# Patient Record
Sex: Female | Born: 1950
Health system: Southern US, Community
[De-identification: ages and names within clinical notes are randomized; demographics above are authoritative.]

## PROBLEM LIST (undated history)

## (undated) DIAGNOSIS — E119 Type 2 diabetes mellitus without complications: Secondary | ICD-10-CM

## (undated) DIAGNOSIS — I10 Essential (primary) hypertension: Secondary | ICD-10-CM

## (undated) DIAGNOSIS — Z972 Presence of dental prosthetic device (complete) (partial): Secondary | ICD-10-CM

## (undated) DIAGNOSIS — K08109 Complete loss of teeth, unspecified cause, unspecified class: Secondary | ICD-10-CM

## (undated) DIAGNOSIS — K219 Gastro-esophageal reflux disease without esophagitis: Secondary | ICD-10-CM

## (undated) DIAGNOSIS — N950 Postmenopausal bleeding: Secondary | ICD-10-CM

## (undated) DIAGNOSIS — N189 Chronic kidney disease, unspecified: Secondary | ICD-10-CM

## (undated) DIAGNOSIS — Z973 Presence of spectacles and contact lenses: Secondary | ICD-10-CM

## (undated) DIAGNOSIS — D649 Anemia, unspecified: Secondary | ICD-10-CM

## (undated) DIAGNOSIS — M1990 Unspecified osteoarthritis, unspecified site: Secondary | ICD-10-CM

## (undated) HISTORY — DX: Anemia, unspecified: D64.9

## (undated) HISTORY — DX: Chronic kidney disease, unspecified: N18.9

---

## 1996-04-12 HISTORY — PX: BREAST MASS EXCISION: SHX1267

## 1998-10-15 ENCOUNTER — Other Ambulatory Visit: Admission: RE | Admit: 1998-10-15 | Discharge: 1998-10-15 | Payer: Self-pay | Admitting: Obstetrics and Gynecology

## 2000-03-09 ENCOUNTER — Other Ambulatory Visit: Admission: RE | Admit: 2000-03-09 | Discharge: 2000-03-09 | Payer: Self-pay | Admitting: Obstetrics and Gynecology

## 2001-07-20 ENCOUNTER — Other Ambulatory Visit: Admission: RE | Admit: 2001-07-20 | Discharge: 2001-07-20 | Payer: Self-pay | Admitting: Obstetrics and Gynecology

## 2002-09-20 ENCOUNTER — Other Ambulatory Visit: Admission: RE | Admit: 2002-09-20 | Discharge: 2002-09-20 | Payer: Self-pay | Admitting: Obstetrics and Gynecology

## 2003-09-26 ENCOUNTER — Other Ambulatory Visit: Admission: RE | Admit: 2003-09-26 | Discharge: 2003-09-26 | Payer: Self-pay | Admitting: Obstetrics and Gynecology

## 2004-10-01 ENCOUNTER — Other Ambulatory Visit: Admission: RE | Admit: 2004-10-01 | Discharge: 2004-10-01 | Payer: Self-pay | Admitting: Obstetrics and Gynecology

## 2018-12-20 DIAGNOSIS — I10 Essential (primary) hypertension: Secondary | ICD-10-CM | POA: Diagnosis not present

## 2018-12-20 DIAGNOSIS — Z6841 Body Mass Index (BMI) 40.0 and over, adult: Secondary | ICD-10-CM | POA: Diagnosis not present

## 2018-12-20 DIAGNOSIS — E1165 Type 2 diabetes mellitus with hyperglycemia: Secondary | ICD-10-CM | POA: Diagnosis not present

## 2018-12-20 DIAGNOSIS — Z299 Encounter for prophylactic measures, unspecified: Secondary | ICD-10-CM | POA: Diagnosis not present

## 2018-12-20 DIAGNOSIS — Z713 Dietary counseling and surveillance: Secondary | ICD-10-CM | POA: Diagnosis not present

## 2019-01-12 DIAGNOSIS — Z0000 Encounter for general adult medical examination without abnormal findings: Secondary | ICD-10-CM | POA: Diagnosis not present

## 2019-01-12 DIAGNOSIS — Z1331 Encounter for screening for depression: Secondary | ICD-10-CM | POA: Diagnosis not present

## 2019-01-12 DIAGNOSIS — Z79899 Other long term (current) drug therapy: Secondary | ICD-10-CM | POA: Diagnosis not present

## 2019-01-12 DIAGNOSIS — Z1339 Encounter for screening examination for other mental health and behavioral disorders: Secondary | ICD-10-CM | POA: Diagnosis not present

## 2019-01-12 DIAGNOSIS — Z299 Encounter for prophylactic measures, unspecified: Secondary | ICD-10-CM | POA: Diagnosis not present

## 2019-01-12 DIAGNOSIS — R5383 Other fatigue: Secondary | ICD-10-CM | POA: Diagnosis not present

## 2019-01-12 DIAGNOSIS — Z1211 Encounter for screening for malignant neoplasm of colon: Secondary | ICD-10-CM | POA: Diagnosis not present

## 2019-01-12 DIAGNOSIS — I10 Essential (primary) hypertension: Secondary | ICD-10-CM | POA: Diagnosis not present

## 2019-01-12 DIAGNOSIS — Z7189 Other specified counseling: Secondary | ICD-10-CM | POA: Diagnosis not present

## 2019-01-12 DIAGNOSIS — E1165 Type 2 diabetes mellitus with hyperglycemia: Secondary | ICD-10-CM | POA: Diagnosis not present

## 2019-01-12 DIAGNOSIS — Z6841 Body Mass Index (BMI) 40.0 and over, adult: Secondary | ICD-10-CM | POA: Diagnosis not present

## 2019-03-15 DIAGNOSIS — R350 Frequency of micturition: Secondary | ICD-10-CM | POA: Diagnosis not present

## 2019-03-15 DIAGNOSIS — Z6841 Body Mass Index (BMI) 40.0 and over, adult: Secondary | ICD-10-CM | POA: Diagnosis not present

## 2019-03-15 DIAGNOSIS — Z1231 Encounter for screening mammogram for malignant neoplasm of breast: Secondary | ICD-10-CM | POA: Diagnosis not present

## 2019-03-15 DIAGNOSIS — Z01411 Encounter for gynecological examination (general) (routine) with abnormal findings: Secondary | ICD-10-CM | POA: Diagnosis not present

## 2019-03-15 DIAGNOSIS — Z23 Encounter for immunization: Secondary | ICD-10-CM | POA: Diagnosis not present

## 2019-03-15 DIAGNOSIS — N950 Postmenopausal bleeding: Secondary | ICD-10-CM | POA: Diagnosis not present

## 2019-03-27 DIAGNOSIS — N858 Other specified noninflammatory disorders of uterus: Secondary | ICD-10-CM | POA: Diagnosis not present

## 2019-03-27 DIAGNOSIS — N950 Postmenopausal bleeding: Secondary | ICD-10-CM | POA: Diagnosis not present

## 2019-03-27 DIAGNOSIS — N840 Polyp of corpus uteri: Secondary | ICD-10-CM | POA: Diagnosis not present

## 2019-03-29 DIAGNOSIS — E1165 Type 2 diabetes mellitus with hyperglycemia: Secondary | ICD-10-CM | POA: Diagnosis not present

## 2019-03-29 DIAGNOSIS — Z299 Encounter for prophylactic measures, unspecified: Secondary | ICD-10-CM | POA: Diagnosis not present

## 2019-03-29 DIAGNOSIS — Z713 Dietary counseling and surveillance: Secondary | ICD-10-CM | POA: Diagnosis not present

## 2019-03-29 DIAGNOSIS — I10 Essential (primary) hypertension: Secondary | ICD-10-CM | POA: Diagnosis not present

## 2019-03-29 DIAGNOSIS — Z6841 Body Mass Index (BMI) 40.0 and over, adult: Secondary | ICD-10-CM | POA: Diagnosis not present

## 2019-05-08 ENCOUNTER — Other Ambulatory Visit (HOSPITAL_COMMUNITY)
Admission: RE | Admit: 2019-05-08 | Discharge: 2019-05-08 | Disposition: A | Discharge status: Home / Self Care | Payer: Medicare Other | Source: Ambulatory Visit | Attending: Obstetrics and Gynecology | Admitting: Obstetrics and Gynecology

## 2019-05-08 DIAGNOSIS — Z01812 Encounter for preprocedural laboratory examination: Secondary | ICD-10-CM | POA: Diagnosis not present

## 2019-05-08 DIAGNOSIS — Z20828 Contact with and (suspected) exposure to other viral communicable diseases: Secondary | ICD-10-CM | POA: Insufficient documentation

## 2019-05-09 ENCOUNTER — Other Ambulatory Visit: Payer: Self-pay | Admitting: Obstetrics and Gynecology

## 2019-05-09 LAB — NOVEL CORONAVIRUS, NAA (HOSP ORDER, SEND-OUT TO REF LAB; TAT 18-24 HRS): SARS-CoV-2, NAA: NOT DETECTED

## 2019-05-10 ENCOUNTER — Other Ambulatory Visit: Payer: Self-pay

## 2019-05-10 ENCOUNTER — Encounter (HOSPITAL_BASED_OUTPATIENT_CLINIC_OR_DEPARTMENT_OTHER): Payer: Self-pay | Admitting: *Deleted

## 2019-05-10 NOTE — Progress Notes (Signed)
Spoke w/ via phone for pre-op interview--- PT Lab needs dos----  CBC, BMP, T&S, EKG COVID test ------ 05-08-2019 Arrive at ------- 1015 NPO after ------ [place] Medications to take morning of surgery ----- NONE Diabetic medication ----- do not take diabetic medication morning of surgery Patient Special Instructions ----- do not take losartan/ hct morning of surgery Pre-Op special Istructions ----- n/a  Patient verbalized understanding of instructions that were given at this phone interview. Patient denies shortness of breath, chest pain, fever, cough a this phone interview.   Anesthesia:  Hx htn, dm2  PCP:  Nicanor Bake NP Cardiologist : no Chest x-ray : no EKG : no Echo : no Stress test: no Cardiac Cath :  no Sleep Study/ CPAP : NO Fasting Blood Sugar :     103 / Checks Blood Sugar -- times a day:   Twice weekly Blood Thinner/ Instructions /Last Dose: no ASA / Instructions/ Last Dose :  ASA 81mg /  Last dose today 05-10-2019/ per pt pt not given instructions to stop

## 2019-05-11 ENCOUNTER — Ambulatory Visit (HOSPITAL_BASED_OUTPATIENT_CLINIC_OR_DEPARTMENT_OTHER)
Admission: RE | Admit: 2019-05-11 | Discharge: 2019-05-11 | Disposition: A | Discharge status: Home / Self Care | Payer: Medicare Other | Source: Ambulatory Visit | Attending: Obstetrics and Gynecology | Admitting: Obstetrics and Gynecology

## 2019-05-11 ENCOUNTER — Ambulatory Visit (HOSPITAL_BASED_OUTPATIENT_CLINIC_OR_DEPARTMENT_OTHER): Payer: Medicare Other | Admitting: Anesthesiology

## 2019-05-11 ENCOUNTER — Encounter (HOSPITAL_BASED_OUTPATIENT_CLINIC_OR_DEPARTMENT_OTHER)
Admission: RE | Disposition: A | Discharge status: Home / Self Care | Payer: Self-pay | Source: Ambulatory Visit | Attending: Obstetrics and Gynecology

## 2019-05-11 ENCOUNTER — Encounter (HOSPITAL_BASED_OUTPATIENT_CLINIC_OR_DEPARTMENT_OTHER): Payer: Self-pay

## 2019-05-11 ENCOUNTER — Other Ambulatory Visit: Payer: Self-pay

## 2019-05-11 DIAGNOSIS — C541 Malignant neoplasm of endometrium: Secondary | ICD-10-CM | POA: Diagnosis not present

## 2019-05-11 DIAGNOSIS — N950 Postmenopausal bleeding: Secondary | ICD-10-CM | POA: Insufficient documentation

## 2019-05-11 DIAGNOSIS — E119 Type 2 diabetes mellitus without complications: Secondary | ICD-10-CM | POA: Diagnosis not present

## 2019-05-11 DIAGNOSIS — Z7984 Long term (current) use of oral hypoglycemic drugs: Secondary | ICD-10-CM | POA: Insufficient documentation

## 2019-05-11 DIAGNOSIS — Z7982 Long term (current) use of aspirin: Secondary | ICD-10-CM | POA: Diagnosis not present

## 2019-05-11 DIAGNOSIS — Z79899 Other long term (current) drug therapy: Secondary | ICD-10-CM | POA: Insufficient documentation

## 2019-05-11 DIAGNOSIS — D259 Leiomyoma of uterus, unspecified: Secondary | ICD-10-CM | POA: Insufficient documentation

## 2019-05-11 DIAGNOSIS — N840 Polyp of corpus uteri: Secondary | ICD-10-CM | POA: Insufficient documentation

## 2019-05-11 DIAGNOSIS — M1990 Unspecified osteoarthritis, unspecified site: Secondary | ICD-10-CM | POA: Diagnosis not present

## 2019-05-11 DIAGNOSIS — M170 Bilateral primary osteoarthritis of knee: Secondary | ICD-10-CM | POA: Insufficient documentation

## 2019-05-11 DIAGNOSIS — K219 Gastro-esophageal reflux disease without esophagitis: Secondary | ICD-10-CM | POA: Insufficient documentation

## 2019-05-11 DIAGNOSIS — I10 Essential (primary) hypertension: Secondary | ICD-10-CM | POA: Diagnosis not present

## 2019-05-11 HISTORY — DX: Presence of spectacles and contact lenses: Z97.3

## 2019-05-11 HISTORY — DX: Complete loss of teeth, unspecified cause, unspecified class: K08.109

## 2019-05-11 HISTORY — DX: Postmenopausal bleeding: N95.0

## 2019-05-11 HISTORY — DX: Essential (primary) hypertension: I10

## 2019-05-11 HISTORY — PX: HYSTEROSCOPY WITH D & C: SHX1775

## 2019-05-11 HISTORY — DX: Gastro-esophageal reflux disease without esophagitis: K21.9

## 2019-05-11 HISTORY — DX: Type 2 diabetes mellitus without complications: E11.9

## 2019-05-11 HISTORY — DX: Unspecified osteoarthritis, unspecified site: M19.90

## 2019-05-11 HISTORY — DX: Presence of dental prosthetic device (complete) (partial): Z97.2

## 2019-05-11 LAB — GLUCOSE, CAPILLARY: Glucose-Capillary: 150 mg/dL — ABNORMAL HIGH (ref 70–99)

## 2019-05-11 LAB — POCT I-STAT, CHEM 8
BUN: 28 mg/dL — ABNORMAL HIGH (ref 8–23)
Calcium, Ion: 1.24 mmol/L (ref 1.15–1.40)
Chloride: 103 mmol/L (ref 98–111)
Creatinine, Ser: 1.2 mg/dL — ABNORMAL HIGH (ref 0.44–1.00)
Glucose, Bld: 191 mg/dL — ABNORMAL HIGH (ref 70–99)
HCT: 43 % (ref 36.0–46.0)
Hemoglobin: 14.6 g/dL (ref 12.0–15.0)
Potassium: 4 mmol/L (ref 3.5–5.1)
Sodium: 139 mmol/L (ref 135–145)
TCO2: 21 mmol/L — ABNORMAL LOW (ref 22–32)

## 2019-05-11 SURGERY — DILATATION AND CURETTAGE /HYSTEROSCOPY
Anesthesia: General

## 2019-05-11 MED ORDER — LIDOCAINE 2% (20 MG/ML) 5 ML SYRINGE
INTRAMUSCULAR | Status: AC
  Filled 2019-05-11: qty 5

## 2019-05-11 MED ORDER — FENTANYL CITRATE (PF) 100 MCG/2ML IJ SOLN
INTRAMUSCULAR | Status: DC | PRN
  Administered 2019-05-11 (×2): 50 ug via INTRAVENOUS

## 2019-05-11 MED ORDER — MIDAZOLAM HCL 2 MG/2ML IJ SOLN
INTRAMUSCULAR | Status: AC
  Filled 2019-05-11: qty 2

## 2019-05-11 MED ORDER — ONDANSETRON HCL 4 MG/2ML IJ SOLN
INTRAMUSCULAR | Status: AC
  Filled 2019-05-11: qty 2

## 2019-05-11 MED ORDER — ONDANSETRON HCL 4 MG/2ML IJ SOLN
4.0000 mg | Freq: Once | INTRAMUSCULAR | Status: DC | PRN
  Filled 2019-05-11: qty 2

## 2019-05-11 MED ORDER — MIDAZOLAM HCL 2 MG/2ML IJ SOLN
INTRAMUSCULAR | Status: DC | PRN
  Administered 2019-05-11: 2 mg via INTRAVENOUS

## 2019-05-11 MED ORDER — LIDOCAINE HCL 1 % IJ SOLN
INTRAMUSCULAR | Status: DC | PRN
  Administered 2019-05-11: 10 mL

## 2019-05-11 MED ORDER — HYDROMORPHONE HCL 1 MG/ML IJ SOLN
0.2500 mg | INTRAMUSCULAR | Status: DC | PRN
  Filled 2019-05-11: qty 0.5

## 2019-05-11 MED ORDER — KETOROLAC TROMETHAMINE 30 MG/ML IJ SOLN
INTRAMUSCULAR | Status: AC
  Filled 2019-05-11: qty 1

## 2019-05-11 MED ORDER — PROPOFOL 10 MG/ML IV BOLUS
INTRAVENOUS | Status: DC | PRN
  Administered 2019-05-11: 150 mg via INTRAVENOUS

## 2019-05-11 MED ORDER — DEXAMETHASONE SODIUM PHOSPHATE 10 MG/ML IJ SOLN
INTRAMUSCULAR | Status: AC
  Filled 2019-05-11: qty 1

## 2019-05-11 MED ORDER — IBUPROFEN 800 MG PO TABS: 800.0000 mg | ORAL_TABLET | Freq: Three times a day (TID) | ORAL | 0 refills | Status: DC | PRN

## 2019-05-11 MED ORDER — HYDROCODONE-ACETAMINOPHEN 5-325 MG PO TABS: 1.0000 | ORAL_TABLET | Freq: Four times a day (QID) | ORAL | 0 refills | Status: DC | PRN

## 2019-05-11 MED ORDER — MEPERIDINE HCL 25 MG/ML IJ SOLN
6.2500 mg | INTRAMUSCULAR | Status: DC | PRN
  Filled 2019-05-11: qty 1

## 2019-05-11 MED ORDER — LACTATED RINGERS IV SOLN
INTRAVENOUS | Status: DC
  Administered 2019-05-11: 11:00:00 via INTRAVENOUS
  Filled 2019-05-11: qty 1000

## 2019-05-11 MED ORDER — LIDOCAINE 2% (20 MG/ML) 5 ML SYRINGE
INTRAMUSCULAR | Status: DC | PRN
  Administered 2019-05-11: 100 mg via INTRAVENOUS

## 2019-05-11 MED ORDER — PROPOFOL 10 MG/ML IV BOLUS
INTRAVENOUS | Status: AC
  Filled 2019-05-11: qty 20

## 2019-05-11 MED ORDER — SODIUM CHLORIDE 0.9 % IR SOLN
Status: DC | PRN
  Administered 2019-05-11: 3000 mL

## 2019-05-11 MED ORDER — ONDANSETRON HCL 4 MG/2ML IJ SOLN
INTRAMUSCULAR | Status: DC | PRN
  Administered 2019-05-11: 4 mg via INTRAVENOUS

## 2019-05-11 MED ORDER — EPHEDRINE SULFATE 50 MG/ML IJ SOLN
INTRAMUSCULAR | Status: DC | PRN
  Administered 2019-05-11: 15 mg via INTRAVENOUS

## 2019-05-11 MED ORDER — DEXAMETHASONE SODIUM PHOSPHATE 10 MG/ML IJ SOLN
INTRAMUSCULAR | Status: DC | PRN
  Administered 2019-05-11: 5 mg via INTRAVENOUS

## 2019-05-11 SURGICAL SUPPLY — 15 items
CANISTER SUCT 3000ML PPV (MISCELLANEOUS) ×6 IMPLANT
CATH ROBINSON RED A/P 16FR (CATHETERS) ×3 IMPLANT
COVER WAND RF STERILE (DRAPES) ×3 IMPLANT
DEVICE MYOSURE LITE (MISCELLANEOUS) IMPLANT
DEVICE MYOSURE REACH (MISCELLANEOUS) IMPLANT
DILATOR CANAL MILEX (MISCELLANEOUS) IMPLANT
GAUZE 4X4 16PLY RFD (DISPOSABLE) ×3 IMPLANT
GLOVE BIO SURGEON STRL SZ7 (GLOVE) ×3 IMPLANT
GOWN STRL REUS W/TWL LRG LVL3 (GOWN DISPOSABLE) ×3 IMPLANT
KIT PROCEDURE FLUENT (KITS) ×3 IMPLANT
PACK VAGINAL MINOR WOMEN LF (CUSTOM PROCEDURE TRAY) ×3 IMPLANT
PAD OB MATERNITY 4.3X12.25 (PERSONAL CARE ITEMS) ×3 IMPLANT
SEAL CERVICAL OMNI LOK (ABLATOR) IMPLANT
SEAL ROD LENS SCOPE MYOSURE (ABLATOR) ×3 IMPLANT
TOWEL OR 17X26 10 PK STRL BLUE (TOWEL DISPOSABLE) ×6 IMPLANT

## 2019-05-11 NOTE — Anesthesia Postprocedure Evaluation (Signed)
Anesthesia Post Note  Patient: Nicole Sampson  Procedure(s) Performed: DILATATION AND CURETTAGE /HYSTEROSCOPY (N/A )     Patient location during evaluation: PACU Anesthesia Type: General Level of consciousness: awake and alert and oriented Pain management: pain level controlled Vital Signs Assessment: post-procedure vital signs reviewed and stable Respiratory status: spontaneous breathing, nonlabored ventilation and respiratory function stable Cardiovascular status: blood pressure returned to baseline and stable Postop Assessment: no apparent nausea or vomiting Anesthetic complications: no    Last Vitals:  Vitals:   05/11/19 1315 05/11/19 1330  BP: 131/69 137/63  Pulse: 89 86  Resp: 20 12  Temp:  37.3 C  SpO2: 99% 94%    Last Pain:  Vitals:   05/11/19 1300  TempSrc:   PainSc: 2                  Keanu Lesniak A.

## 2019-05-11 NOTE — Anesthesia Procedure Notes (Signed)
Procedure Name: LMA Insertion Date/Time: 05/11/2019 11:58 AM Performed by: Wanita Chamberlain, CRNA Pre-anesthesia Checklist: Patient identified, Emergency Drugs available, Suction available, Patient being monitored and Timeout performed Patient Re-evaluated:Patient Re-evaluated prior to induction Oxygen Delivery Method: Circle system utilized Preoxygenation: Pre-oxygenation with 100% oxygen Induction Type: IV induction Ventilation: Mask ventilation without difficulty LMA: LMA inserted LMA Size: 4.0 Number of attempts: 1 Placement Confirmation: breath sounds checked- equal and bilateral,  CO2 detector and positive ETCO2 Tube secured with: Tape Dental Injury: Teeth and Oropharynx as per pre-operative assessment

## 2019-05-11 NOTE — Op Note (Signed)
Pre-Operative Diagnosis: 1) Postmenopausal bleeding  Postoperative Diagnosis: 1) Postmenopausal bleeding 2) Endometrial polyps Procedure: Hysteroscopy, dilation and curettage Surgeon: Dr. Vanessa Kick Assistant: None Operative Findings: Multiple small endometrial polyps noted. Bilateral tubal ostia visualized. Optimal visualization of the endometrial cavity was limited due to murky hysteroscopy fluid. Fluid deficit 150 cc Specimen: Endometrial curettings EBL: Total I/O In: -  Out: 10 [Blood:10]   Nicole Sampson Is a 68 year old post-menopausal female who presents for definitive surgical management for postmenopausal bleeding. Please see the patient's history and physical for complete details of the history. Management options were discussed with the patient. R/B/A reviewed. Following appropriate informed consent was taken to the operating room. The patient was appropriately identified during a time out procedure. General LMA anesthesia was administered and the patient was placed in the dorsal lithotomy position. The patient was prepped and draped in the normal sterile fashion. A speculum was placed into the vagina, a single-tooth tenaculum was placed on the anterior lip of the cervix, and 10 cc of 1% lidocaine was administered in a paracervical fashion. The cervix was serially dilated with Hank dilators. The hysteroscope was introduced for the above findings. The hysteroscope was removed and a sharp curettage was performed. THis procedure was repeated x 2 until a gritty texture was noted. This completed the surgical procedure. THe speculum and single toothed tenaculum were removed. The patient was transferred to the PACU in stable condition following the procedure.

## 2019-05-11 NOTE — H&P (Signed)
Nicole Sampson is an 68 y.o. female.   68 yo female presents for surgical management of postmenopausal bleeding. Ultrasound in the office showed a thickened endometrium of 64mm. Additionally, multiple uterine fibroids were noted. An office endometrial bx was benign. Given thickened lining and bleeding the recommendation for hysteroscopy D&C was made. R/B/A reviewed and the patient wishes to proceed. No LMP recorded. Patient is postmenopausal.    Past Medical History:  Diagnosis Date  . Arthritis    knees  . Full dentures   . GERD (gastroesophageal reflux disease)   . Hypertension    followed by pcp  (05-10-2019  per pt never had a stress test)  . PMB (postmenopausal bleeding)   . Type 2 diabetes mellitus ([place])    followed by pcp  (05-10-2019 check's cbg twice weekly,  fasting cbg-- 103)  . Wears glasses     Past Surgical History:  Procedure Laterality Date  . BREAST MASS EXCISION Left 04/1996    History reviewed. No pertinent family history.  Social History:  reports that she has never smoked. She has never used smokeless tobacco. She reports that she does not drink alcohol or use drugs.  Allergies:  Allergies  Allergen Reactions  . Other Other (See Comments)    "lime jello causes swelling"    Medications Prior to Admission  Medication Sig Dispense Refill Last Dose  . aspirin EC 81 MG tablet Take 81 mg by mouth daily.   05/10/2019 at Unknown time  . calcium carbonate (TUMS - DOSED IN MG ELEMENTAL CALCIUM) 500 MG chewable tablet Chew 1 tablet by mouth as needed for indigestion or heartburn.   Past Month at Unknown time  . Calcium Citrate-Vitamin D (CALCIUM + D PO) Take by mouth daily.   05/10/2019 at Unknown time  . hydrochlorothiazide (MICROZIDE) 12.5 MG capsule Take 12.5 mg by mouth daily.   05/10/2019 at Unknown time  . losartan (COZAAR) 50 MG tablet Take 50 mg by mouth daily.   05/10/2019 at Unknown time  . metFORMIN (GLUCOPHAGE) 500 MG tablet Take by mouth daily with  breakfast.   05/10/2019 at Unknown time  . Multiple Vitamin (MULTIVITAMIN) tablet Take 1 tablet by mouth daily.   05/10/2019 at Unknown time    ROS  Blood pressure 136/77, pulse 99, temperature 98.4 F (36.9 C), temperature source Oral, resp. rate 16, height 5\' 8"  (1.727 m), weight 123.6 kg, SpO2 98 %. Physical Exam   AOX3, NAD Normal work of breasthing Abd soft  Results for orders placed or performed during the hospital encounter of 05/11/19 (from the past 24 hour(s))  I-STAT, chem 8     Status: Abnormal   Collection Time: 05/11/19 10:39 AM  Result Value Ref Range   Sodium 139 135 - 145 mmol/L   Potassium 4.0 3.5 - 5.1 mmol/L   Chloride 103 98 - 111 mmol/L   BUN 28 (H) 8 - 23 mg/dL   Creatinine, Ser 1.20 (H) 0.44 - 1.00 mg/dL   Glucose, Bld 191 (H) 70 - 99 mg/dL   Calcium, Ion 1.24 1.15 - 1.40 mmol/L   TCO2 21 (L) 22 - 32 mmol/L   Hemoglobin 14.6 12.0 - 15.0 g/dL   HCT 43.0 36.0 - 46.0 %    No results found.  Assessment/Plan: 1) Proceed with hysteroscopy, Dilation and curettage  2) SCDs   Vanessa Kick 05/11/2019, 11:33 AM

## 2019-05-11 NOTE — Transfer of Care (Signed)
Immediate Anesthesia Transfer of Care Note  Patient: Nicole Sampson  Procedure(s) Performed: DILATATION AND CURETTAGE /HYSTEROSCOPY (N/A )  Patient Location: PACU  Anesthesia Type:General  Level of Consciousness: awake, alert , oriented and patient cooperative  Airway & Oxygen Therapy: Patient Spontanous Breathing and Patient connected to nasal cannula oxygen  Post-op Assessment: Report given to RN and Post -op Vital signs reviewed and stable  Post vital signs: Reviewed and stable  Last Vitals:  Vitals Value Taken Time  BP    Temp    Pulse    Resp    SpO2      Last Pain:  Vitals:   05/11/19 1031  TempSrc: Oral  PainSc: 0-No pain      Patients Stated Pain Goal: 7 [phone]  Complications: No apparent anesthesia complications

## 2019-05-11 NOTE — Discharge Instructions (Signed)

## 2019-05-11 NOTE — Anesthesia Preprocedure Evaluation (Addendum)
Anesthesia Evaluation  Patient identified by MRN, date of birth, ID band Patient awake    Reviewed: Allergy & Precautions, NPO status , Patient's Chart, lab work & pertinent test results  Airway Mallampati: II  TM Distance: >3 FB Neck ROM: Full    Dental  (+) Edentulous Lower, Edentulous Upper, Dental Advisory Given   Pulmonary    Pulmonary exam normal breath sounds clear to auscultation       Cardiovascular hypertension, Pt. on medications Normal cardiovascular exam Rhythm:Regular Rate:Normal     Neuro/Psych    GI/Hepatic GERD  Controlled and Medicated,  Endo/Other  diabetes, Type 2  Renal/GU      Musculoskeletal   Abdominal   Peds  Hematology   Anesthesia Other Findings   Reproductive/Obstetrics                           Anesthesia Physical Anesthesia Plan  ASA: III  Anesthesia Plan: General   Post-op Pain Management:    Induction: Intravenous  PONV Risk Score and Plan: 3 and Ondansetron, Midazolam and Treatment may vary due to age or medical condition  Airway Management Planned: LMA and Oral ETT  Additional Equipment:   Intra-op Plan:   Post-operative Plan: Extubation in OR  Informed Consent: I have reviewed the patients History and Physical, chart, labs and discussed the procedure including the risks, benefits and alternatives for the proposed anesthesia with the patient or authorized representative who has indicated his/her understanding and acceptance.       Plan Discussed with: CRNA, Surgeon and Anesthesiologist  Anesthesia Plan Comments:        Anesthesia Quick Evaluation

## 2019-05-12 LAB — SURGICAL PATHOLOGY

## 2019-05-15 ENCOUNTER — Encounter (HOSPITAL_BASED_OUTPATIENT_CLINIC_OR_DEPARTMENT_OTHER): Payer: Self-pay | Admitting: Obstetrics and Gynecology

## 2019-05-16 ENCOUNTER — Telehealth: Payer: Self-pay | Admitting: *Deleted

## 2019-05-16 NOTE — Telephone Encounter (Signed)
Called the patient and scheduled an appt for 11/18. Gave the patient the address and phone number. Also gave the patient the policy for parking, mask and no visitors.

## 2019-05-31 ENCOUNTER — Inpatient Hospital Stay: Payer: Medicare Other | Attending: Gynecologic Oncology | Admitting: Gynecologic Oncology

## 2019-05-31 ENCOUNTER — Encounter: Payer: Self-pay | Admitting: Gynecologic Oncology

## 2019-05-31 ENCOUNTER — Other Ambulatory Visit: Payer: Self-pay | Admitting: Gynecologic Oncology

## 2019-05-31 ENCOUNTER — Other Ambulatory Visit: Payer: Self-pay

## 2019-05-31 VITALS — BP 149/75 | HR 88 | Temp 98.2°F | Resp 17 | Ht 68.0 in | Wt 270.7 lb

## 2019-05-31 DIAGNOSIS — Z79899 Other long term (current) drug therapy: Secondary | ICD-10-CM | POA: Insufficient documentation

## 2019-05-31 DIAGNOSIS — M1990 Unspecified osteoarthritis, unspecified site: Secondary | ICD-10-CM | POA: Diagnosis not present

## 2019-05-31 DIAGNOSIS — E119 Type 2 diabetes mellitus without complications: Secondary | ICD-10-CM | POA: Diagnosis not present

## 2019-05-31 DIAGNOSIS — C541 Malignant neoplasm of endometrium: Secondary | ICD-10-CM | POA: Insufficient documentation

## 2019-05-31 DIAGNOSIS — K219 Gastro-esophageal reflux disease without esophagitis: Secondary | ICD-10-CM | POA: Insufficient documentation

## 2019-05-31 DIAGNOSIS — I10 Essential (primary) hypertension: Secondary | ICD-10-CM | POA: Diagnosis not present

## 2019-05-31 DIAGNOSIS — Z7982 Long term (current) use of aspirin: Secondary | ICD-10-CM | POA: Diagnosis not present

## 2019-05-31 DIAGNOSIS — Z6841 Body Mass Index (BMI) 40.0 and over, adult: Secondary | ICD-10-CM | POA: Diagnosis not present

## 2019-05-31 DIAGNOSIS — Z7984 Long term (current) use of oral hypoglycemic drugs: Secondary | ICD-10-CM | POA: Insufficient documentation

## 2019-05-31 MED ORDER — SENNOSIDES-DOCUSATE SODIUM 8.6-50 MG PO TABS: 2.0000 | ORAL_TABLET | Freq: Every day | ORAL | 0 refills | Status: DC

## 2019-05-31 MED ORDER — TRAMADOL HCL 50 MG PO TABS: 50.0000 mg | ORAL_TABLET | Freq: Four times a day (QID) | ORAL | 0 refills | Status: DC | PRN

## 2019-05-31 NOTE — Patient Instructions (Addendum)
Preparing for your Surgery  Plan for surgery on July 25, 2019 with Dr. Everitt Amber at [facility] will be scheduled for a robotic assisted total laparoscopic hysterectomy, bilateral salpingo-oophorectomy, sentinel lymph node biopsy.   STOP ASPIRIN NOW  Pre-operative Testing -You will receive a phone call from presurgical testing at [facility] if you have not received a call already to arrange for a pre-operative testing appointment before your surgery.  This appointment normally occurs one to two weeks before your scheduled surgery.   -Bring your insurance card, copy of an advanced directive if applicable, medication list  -At that visit, you will be asked to sign a consent for a possible blood transfusion in case a transfusion becomes necessary during surgery.  The need for a blood transfusion is rare but having consent is a necessary part of your care.     -Do not take supplements such as fish oil (omega 3), red yeast rice, tumeric before your surgery.  Day Before Surgery at [place] will be asked to take in a light diet the day before surgery.  Avoid carbonated beverages.  You will be advised to have nothing to eat or drink after midnight the evening before.    Eat a light diet the day before surgery.  Examples including soups, broths, toast, yogurt, mashed potatoes.  Things to avoid include carbonated beverages (fizzy beverages), raw fruits and raw vegetables, or beans.   If your bowels are filled with gas, your surgeon will have difficulty visualizing your pelvic organs which increases your surgical risks.  Your role in recovery Your role is to become active as soon as directed by your doctor, while still giving yourself time to heal.  Rest when you feel tired. You will be asked to do the following in order to speed your recovery:  - Cough and breathe deeply. This helps toclear and expand your lungs and can prevent pneumonia.  - Do mild physical activity.  Walking or moving your legs help your circulation and body functions return to normal. A staff member will help you when you try to walk and will provide you with simple exercises. Do not try to get up or walk alone the first time. - Actively manage your pain. Managing your pain lets you move in comfort. We will ask you to rate your pain on a scale of zero to 10. It is your responsibility to tell your doctor or nurse where and how much you hurt so your pain can be treated.  Special Considerations -If you are diabetic, you may be placed on insulin after surgery to have closer control over your blood sugars to promote healing and recovery.  This does not mean that you will be discharged on insulin.  If applicable, your oral antidiabetics will be resumed when you are tolerating a solid diet.  -Your final pathology results from surgery should be available around one week after surgery and the results will be relayed to you when available.  -Dr. Lahoma Crocker is the surgeon that assists your GYN Oncologist with surgery.  If you end up staying the night, the next day after your surgery you will either see Dr. Denman George or Dr. Lahoma Crocker.  -FMLA forms can be faxed to [phone] and please allow 5-7 business days for completion.  Pain Management After Surgery -You have been prescribed your pain medication and bowel regimen medications before surgery so that you can have these available when you are discharged from the hospital. The  pain medication is for use ONLY AFTER surgery and a new prescription will not be given.   -Make sure that you have Tylenol and Ibuprofen at home to use on a regular basis after surgery for pain control. We recommend alternating the medications every hour to six hours since they work differently and are processed in the body differently for pain relief.  -Review the attached handout on narcotic use and their risks and side effects.   Bowel Regimen -You have been  prescribed Sennakot-S to take nightly to prevent constipation especially if you are taking the narcotic pain medication intermittently.  It is important to prevent constipation and drink adequate amounts of liquids.  Blood Transfusion Information WHAT IS A BLOOD TRANSFUSION? A transfusion is the replacement of blood or some of its parts. Blood is made up of multiple cells which provide different functions.  Red blood cells carry oxygen and are used for blood loss replacement.  White blood cells fight against infection.  Platelets control bleeding.  Plasma helps clot blood.  Other blood products are available for specialized needs, such as hemophilia or other clotting disorders. BEFORE THE TRANSFUSION  Who gives blood for transfusions?   You may be able to donate blood to be used at a later date on yourself (autologous donation).  Relatives can be asked to donate blood. This is generally not any safer than if you have received blood from a stranger. The same precautions are taken to ensure safety when a relative's blood is donated.  Healthy volunteers who are fully evaluated to make sure their blood is safe. This is blood bank blood. Transfusion therapy is the safest it has ever been in the practice of medicine. Before blood is taken from a donor, a complete history is taken to make sure that person has no history of diseases nor engages in risky social behavior (examples are intravenous drug use or sexual activity with multiple partners). The donor's travel history is screened to minimize risk of transmitting infections, such as malaria. The donated blood is tested for signs of infectious diseases, such as HIV and hepatitis. The blood is then tested to be sure it is compatible with you in order to minimize the chance of a transfusion reaction. If you or a relative donates blood, this is often done in anticipation of surgery and is not appropriate for emergency situations. It takes many days to  process the donated blood. RISKS AND COMPLICATIONS Although transfusion therapy is very safe and saves many lives, the main dangers of transfusion include:   Getting an infectious disease.  Developing a transfusion reaction. This is an allergic reaction to something in the blood you were given. Every precaution is taken to prevent this. The decision to have a blood transfusion has been considered carefully by your caregiver before blood is given. Blood is not given unless the benefits outweigh the risks.  AFTER SURGERY INSTRUCTIONS  05/31/2019   Return to work: 4-6 weeks if applicable  Activity: 1. Be up and out of the bed during the day.  Take a nap if needed.  You may walk up steps but be careful and use the hand rail.  Stair climbing will tire you more than you think, you may need to stop part way and rest.   2. No lifting or straining for 6 weeks.  3. No driving for 1 week(s).  Do not drive if you are taking narcotic pain medicine.  4. Shower daily.  Use soap and water on your  incision and pat dry; don't rub.  No tub baths until cleared by your surgeon.   5. No sexual activity and nothing in the vagina for 8 weeks.  6. You may experience a small amount of clear drainage from your incisions, which is normal.  If the drainage persists or increases, please call the office.  7. You may experience vaginal spotting after surgery or around the 6-8 week mark from surgery when the stitches at the top of the vagina begin to dissolve.  The spotting is normal but if you experience heavy bleeding, call our office.  8. Take Tylenol or ibuprofen first for pain and only use narcotic pain medication for severe pain not relieved by the Tylenol or Ibuprofen.  Monitor your Tylenol intake to a max of 4,000 mg.  Diet: 1. Low sodium Heart Healthy Diet is recommended.  2. It is safe to use a laxative, such as Miralax or Colace, if you have difficulty moving your bowels. You can take Sennakot at  bedtime every evening to keep bowel movements regular and to prevent constipation.    Wound Care: 1. Keep clean and dry.  Shower daily.  Reasons to call the Doctor:  Fever - Oral temperature greater than 100.4 degrees Fahrenheit  Foul-smelling vaginal discharge  Difficulty urinating  Nausea and vomiting  Increased pain at the site of the incision that is unrelieved with pain medicine.  Difficulty breathing with or without chest pain  New calf pain especially if only on one side  Sudden, continuing increased vaginal bleeding with or without clots.   Contacts: For questions or concerns you should contact:  Dr. Everitt Amber at [phone]  Joylene John, NP at [phone]  After Hours: call [phone] and have the GYN Oncologist paged/contacted

## 2019-05-31 NOTE — Progress Notes (Signed)
Ms Braccia will have her last A1c results faxed to [place] as well as the A1c to be drawn in December. I provided her our fax number.

## 2019-05-31 NOTE — Progress Notes (Signed)
Consult Note: Gyn-Onc  Consult was requested by Dr. Vanessa Kick for the evaluation of Nicole Sampson 68 y.o. female  CC:  Chief Complaint  Patient presents with  . Endometrial adenocarcinoma [facility])    Assessment/Plan:  [name]  is a 68 y.o.  year old with grade 1 endometrial cancer in the setting of morbid obesity (BMI 41kg/m2).  A detailed discussion was held with the patient and her family with regard to to her endometrial cancer diagnosis. We discussed the standard management options for uterine cancer which includes surgery followed possibly by adjuvant therapy depending on the results of surgery. The options for surgical management include a hysterectomy and removal of the tubes and ovaries possibly with removal of pelvic and para-aortic lymph nodes.If feasible, a minimally invasive approach including a robotic hysterectomy or laparoscopic hysterectomy have benefits including shorter hospital stay, recovery time and better wound healing than with open surgery. The patient has been counseled about these surgical options and the risks of surgery in general including infection, bleeding, damage to surrounding structures (including bowel, bladder, ureters, nerves or vessels), and the postoperative risks of PE/ DVT, and lymphedema. I extensively reviewed the additional risks of robotic hysterectomy including possible need for conversion to open laparotomy.  I discussed positioning during surgery of trendelenberg and risks of minor facial swelling and care we take in preoperative positioning.  After counseling and consideration of her options, she desires to proceed with robotic assisted total hysterectomy with bilateral sapingo-oophorectomy and SLN biopsy.   She will be seen by anesthesia for preoperative clearance and discussion of postoperative pain management.  She was given the opportunity to ask questions, which were answered to her satisfaction, and she is agreement with the above  mentioned plan of care.  She will stop her ASA preoperatively.   HPI: [name] is a 68 year old P0 who is seen in consultation at the request of Dr Vanessa Kick for evaluation of endometrial cancer.  The patient reported having postmenopausal bleeding beginning in August 2020.  She had a scheduled gynecology visit with Dr. Harrington Challenger for October 2020 and decided she would bring it up at this time.  At her initial visit she had a Pap test that was normal.  She return for separate visit for further work-up of her bleeding.  At that visit a transvaginal ultrasound scan was performed which confirmed a thickened endometrial stripe this was performed on March 15, 2019.  An endometrial Pipelle biopsy was performed in the office which was benign.  She was then taken for hysteroscopy D&C procedure.  This was performed on May 11, 2019 and revealed grade 1 endometrial cancer.  Patient is morbidly obese with a BMI of 41 kg meters squared.  She has hypertension and diabetes mellitus.  She reports her last hemoglobin A1c earlier this year was 6.3%.  She takes aspirin for heart health.  She does not take insulin.  She has never been pregnant.  She lives alone.  Her family history is significant for mother who had uterine cancer in her 29s who was overweight.  She has never had abdominal surgery.  Current Meds:  Outpatient Encounter Medications as of 05/31/2019  Medication Sig  . aspirin EC 81 MG tablet Take 81 mg by mouth daily.  . calcium carbonate (TUMS - DOSED IN MG ELEMENTAL CALCIUM) 500 MG chewable tablet Chew 1 tablet by mouth as needed for indigestion or heartburn.  . Calcium Citrate-Vitamin D (CALCIUM + D PO) Take  by mouth daily.  Marland Kitchen GARLIC PO Take by mouth.  . hydrochlorothiazide (MICROZIDE) 12.5 MG capsule Take 12.5 mg by mouth daily.  Marland Kitchen ibuprofen (ADVIL) 800 MG tablet Take 1 tablet (800 mg total) by mouth every 8 (eight) hours as needed.  Marland Kitchen losartan (COZAAR) 50 MG tablet Take 50 mg by  mouth daily.  . metFORMIN (GLUCOPHAGE) 500 MG tablet Take by mouth daily with breakfast.  . Multiple Vitamin (MULTIVITAMIN) tablet Take 1 tablet by mouth daily.  Marland Kitchen HYDROcodone-acetaminophen (NORCO/VICODIN) 5-325 MG tablet Take 1 tablet by mouth every 6 (six) hours as needed for moderate pain. (Patient not taking: Reported on 05/31/2019)   No facility-administered encounter medications on file as of 05/31/2019.     Allergy:  Allergies  Allergen Reactions  . Other Other (See Comments)    "lime jello causes swelling"    Social Hx:   Social History   Socioeconomic History  . Marital status: Single    Spouse name: Not on file  . Number of children: Not on file  . Years of education: Not on file  . Highest education level: Not on file  Occupational History  . Not on file  Social Needs  . Financial resource strain: Not on file  . Food insecurity    Worry: Not on file    Inability: Not on file  . Transportation needs    Medical: Not on file    Non-medical: Not on file  Tobacco Use  . Smoking status: Never Smoker  . Smokeless tobacco: Never Used  Substance and Sexual Activity  . Alcohol use: Never    Frequency: Never  . Drug use: Never  . Sexual activity: Not on file  Lifestyle  . Physical activity    Days per week: Not on file    Minutes per session: Not on file  . Stress: Not on file  Relationships  . Social Herbalist on phone: Not on file    Gets together: Not on file    Attends religious service: Not on file    Active member of club or organization: Not on file    Attends meetings of clubs or organizations: Not on file    Relationship status: Not on file  . Intimate partner violence    Fear of current or ex partner: Not on file    Emotionally abused: Not on file    Physically abused: Not on file    Forced sexual activity: Not on file  Other Topics Concern  . Not on file  Social History Narrative  . Not on file    Past Surgical Hx:  Past  Surgical History:  Procedure Laterality Date  . BREAST MASS EXCISION Left 04/1996  . HYSTEROSCOPY W/D&C N/A 05/11/2019   Procedure: DILATATION AND CURETTAGE /HYSTEROSCOPY;  Surgeon: Vanessa Kick, MD;  Location: [facility];  Service: Gynecology;  Laterality: N/A;    Past Medical Hx:  Past Medical History:  Diagnosis Date  . Arthritis    knees  . Full dentures   . GERD (gastroesophageal reflux disease)   . Hypertension    followed by pcp  (05-10-2019  per pt never had a stress test)  . PMB (postmenopausal bleeding)   . Type 2 diabetes mellitus ([place])    followed by pcp  (05-10-2019 check's cbg twice weekly,  fasting cbg-- 103)  . Wears glasses     Past Gynecological History:  G0 No LMP recorded. Patient is postmenopausal.  Family Hx:  Family History  Problem Relation Age of Onset  . Uterine cancer Mother   . Diabetes Mother   . Hypertension Mother   . Diabetes Father   . Hypertension Brother     Review of Systems:  Constitutional  Feels well,    ENT Normal appearing ears and nares bilaterally Skin/Breast  No rash, sores, jaundice, itching, dryness Cardiovascular  No chest pain, shortness of breath, or edema  Pulmonary  No cough or wheeze.  Gastro Intestinal  No nausea, vomitting, or diarrhoea. No bright red blood per rectum, no abdominal pain, change in bowel movement, or constipation.  Genito Urinary  No frequency, urgency, dysuria, + postmenopausal bleeding Musculo Skeletal  No myalgia, arthralgia, joint swelling or pain  Neurologic  No weakness, numbness, change in gait,  Psychology  No depression, anxiety, insomnia.   Vitals:  Blood pressure (!) 149/75, pulse 88, temperature 98.2 F (36.8 C), temperature source Temporal, resp. rate 17, height 5\' 8"  (1.727 m), weight 270 lb 11.2 oz (122.8 kg), SpO2 100 %.  Physical Exam: WD in NAD Neck  Supple NROM, without any enlargements.  Lymph Node Survey No cervical supraclavicular or inguinal  adenopathy Cardiovascular  Pulse normal rate, regularity and rhythm. S1 and S2 normal.  Lungs  Clear to auscultation bilateraly, without wheezes/crackles/rhonchi. Good air movement.  Skin  No rash/lesions/breakdown  Psychiatry  Alert and oriented to person, place, and time  Abdomen  Normoactive bowel sounds, abdomen soft, non-tender and obese without evidence of hernia.  Back No CVA tenderness Genito Urinary  Vulva/vagina: Normal external female genitalia.  No lesions. No discharge or bleeding.  Bladder/urethra:  No lesions or masses, well supported bladder  Vagina: normal, narrow  Cervix: small, Normal appearing, no lesions.  Uterus: slightly bulky, mobile, no parametrial involvement or nodularity.  Adnexa: no palpable masses. Rectal  deferred Extremities  No bilateral cyanosis, clubbing or edema.   Thereasa Solo, MD  05/31/2019, 11:21 AM

## 2019-05-31 NOTE — H&P (View-Only) (Signed)
Consult Note: Gyn-Onc  Consult was requested by Dr. Vanessa Kick for the evaluation of Nicole Sampson 68 y.o. female  CC:  Chief Complaint  Patient presents with  . Endometrial adenocarcinoma [facility])    Assessment/Plan:  Nicole. Nicole Sampson  is a 68 y.o.  year old with grade 1 endometrial cancer in the setting of morbid obesity (BMI 41kg/m2).  A detailed discussion was held with the patient and her family with regard to to her endometrial cancer diagnosis. We discussed the standard management options for uterine cancer which includes surgery followed possibly by adjuvant therapy depending on the results of surgery. The options for surgical management include a hysterectomy and removal of the tubes and ovaries possibly with removal of pelvic and para-aortic lymph nodes.If feasible, a minimally invasive approach including a robotic hysterectomy or laparoscopic hysterectomy have benefits including shorter hospital stay, recovery time and better wound healing than with open surgery. The patient has been counseled about these surgical options and the risks of surgery in general including infection, bleeding, damage to surrounding structures (including bowel, bladder, ureters, nerves or vessels), and the postoperative risks of PE/ DVT, and lymphedema. I extensively reviewed the additional risks of robotic hysterectomy including possible need for conversion to open laparotomy.  I discussed positioning during surgery of trendelenberg and risks of minor facial swelling and care we take in preoperative positioning.  After counseling and consideration of her options, she desires to proceed with robotic assisted total hysterectomy with bilateral sapingo-oophorectomy and SLN biopsy.   She will be seen by anesthesia for preoperative clearance and discussion of postoperative pain management.  She was given the opportunity to ask questions, which were answered to her satisfaction, and she is agreement with the above  mentioned plan of care.  She will stop her ASA preoperatively.   HPI: Nicole Sampson is a 68 year old P0 who is seen in consultation at the request of Dr Vanessa Kick for evaluation of endometrial cancer.  The patient reported having postmenopausal bleeding beginning in August 2020.  She had a scheduled gynecology visit with Dr. Harrington Challenger for October 2020 and decided she would bring it up at this time.  At her initial visit she had a Pap test that was normal.  She return for separate visit for further work-up of her bleeding.  At that visit a transvaginal ultrasound scan was performed which confirmed a thickened endometrial stripe this was performed on March 15, 2019.  An endometrial Pipelle biopsy was performed in the office which was benign.  She was then taken for hysteroscopy D&C procedure.  This was performed on May 11, 2019 and revealed grade 1 endometrial cancer.  Patient is morbidly obese with a BMI of 41 kg meters squared.  She has hypertension and diabetes mellitus.  She reports her last hemoglobin A1c earlier this year was 6.3%.  She takes aspirin for heart health.  She does not take insulin.  She has never been pregnant.  She lives alone.  Her family history is significant for mother who had uterine cancer in her 68s who was overweight.  She has never had abdominal surgery.  Current Meds:  Outpatient Encounter Medications as of 05/31/2019  Medication Sig  . aspirin EC 81 MG tablet Take 81 mg by mouth daily.  . calcium carbonate (TUMS - DOSED IN MG ELEMENTAL CALCIUM) 500 MG chewable tablet Chew 1 tablet by mouth as needed for indigestion or heartburn.  . Calcium Citrate-Vitamin D (CALCIUM + D PO) Take  by mouth daily.  Marland Kitchen GARLIC PO Take by mouth.  . hydrochlorothiazide (MICROZIDE) 12.5 MG capsule Take 12.5 mg by mouth daily.  Marland Kitchen ibuprofen (ADVIL) 800 MG tablet Take 1 tablet (800 mg total) by mouth every 8 (eight) hours as needed.  Marland Kitchen losartan (COZAAR) 50 MG tablet Take 50 mg by  mouth daily.  . metFORMIN (GLUCOPHAGE) 500 MG tablet Take by mouth daily with breakfast.  . Multiple Vitamin (MULTIVITAMIN) tablet Take 1 tablet by mouth daily.  Marland Kitchen HYDROcodone-acetaminophen (NORCO/VICODIN) 5-325 MG tablet Take 1 tablet by mouth every 6 (six) hours as needed for moderate pain. (Patient not taking: Reported on 05/31/2019)   No facility-administered encounter medications on file as of 05/31/2019.     Allergy:  Allergies  Allergen Reactions  . Other Other (See Comments)    "lime jello causes swelling"    Social Hx:   Social History   Socioeconomic History  . Marital status: Single    Spouse name: Not on file  . Number of children: Not on file  . Years of education: Not on file  . Highest education level: Not on file  Occupational History  . Not on file  Social Needs  . Financial resource strain: Not on file  . Food insecurity    Worry: Not on file    Inability: Not on file  . Transportation needs    Medical: Not on file    Non-medical: Not on file  Tobacco Use  . Smoking status: Never Smoker  . Smokeless tobacco: Never Used  Substance and Sexual Activity  . Alcohol use: Never    Frequency: Never  . Drug use: Never  . Sexual activity: Not on file  Lifestyle  . Physical activity    Days per week: Not on file    Minutes per session: Not on file  . Stress: Not on file  Relationships  . Social Herbalist on phone: Not on file    Gets together: Not on file    Attends religious service: Not on file    Active member of club or organization: Not on file    Attends meetings of clubs or organizations: Not on file    Relationship status: Not on file  . Intimate partner violence    Fear of current or ex partner: Not on file    Emotionally abused: Not on file    Physically abused: Not on file    Forced sexual activity: Not on file  Other Topics Concern  . Not on file  Social History Narrative  . Not on file    Past Surgical Hx:  Past  Surgical History:  Procedure Laterality Date  . BREAST MASS EXCISION Left 04/1996  . HYSTEROSCOPY W/D&C N/A 05/11/2019   Procedure: DILATATION AND CURETTAGE /HYSTEROSCOPY;  Surgeon: Vanessa Kick, MD;  Location: [facility];  Service: Gynecology;  Laterality: N/A;    Past Medical Hx:  Past Medical History:  Diagnosis Date  . Arthritis    knees  . Full dentures   . GERD (gastroesophageal reflux disease)   . Hypertension    followed by pcp  (05-10-2019  per pt never had a stress test)  . PMB (postmenopausal bleeding)   . Type 2 diabetes mellitus ([place])    followed by pcp  (05-10-2019 check's cbg twice weekly,  fasting cbg-- 103)  . Wears glasses     Past Gynecological History:  G0 No LMP recorded. Patient is postmenopausal.  Family Hx:  Family History  Problem Relation Age of Onset  . Uterine cancer Mother   . Diabetes Mother   . Hypertension Mother   . Diabetes Father   . Hypertension Brother     Review of Systems:  Constitutional  Feels well,    ENT Normal appearing ears and nares bilaterally Skin/Breast  No rash, sores, jaundice, itching, dryness Cardiovascular  No chest pain, shortness of breath, or edema  Pulmonary  No cough or wheeze.  Gastro Intestinal  No nausea, vomitting, or diarrhoea. No bright red blood per rectum, no abdominal pain, change in bowel movement, or constipation.  Genito Urinary  No frequency, urgency, dysuria, + postmenopausal bleeding Musculo Skeletal  No myalgia, arthralgia, joint swelling or pain  Neurologic  No weakness, numbness, change in gait,  Psychology  No depression, anxiety, insomnia.   Vitals:  Blood pressure (!) 149/75, pulse 88, temperature 98.2 F (36.8 C), temperature source Temporal, resp. rate 17, height 5\' 8"  (1.727 m), weight 270 lb 11.2 oz (122.8 kg), SpO2 100 %.  Physical Exam: WD in NAD Neck  Supple NROM, without any enlargements.  Lymph Node Survey No cervical supraclavicular or inguinal  adenopathy Cardiovascular  Pulse normal rate, regularity and rhythm. S1 and S2 normal.  Lungs  Clear to auscultation bilateraly, without wheezes/crackles/rhonchi. Good air movement.  Skin  No rash/lesions/breakdown  Psychiatry  Alert and oriented to person, place, and time  Abdomen  Normoactive bowel sounds, abdomen soft, non-tender and obese without evidence of hernia.  Back No CVA tenderness Genito Urinary  Vulva/vagina: Normal external female genitalia.  No lesions. No discharge or bleeding.  Bladder/urethra:  No lesions or masses, well supported bladder  Vagina: normal, narrow  Cervix: small, Normal appearing, no lesions.  Uterus: slightly bulky, mobile, no parametrial involvement or nodularity.  Adnexa: no palpable masses. Rectal  deferred Extremities  No bilateral cyanosis, clubbing or edema.   Thereasa Solo, MD  05/31/2019, 11:21 AM

## 2019-06-12 ENCOUNTER — Other Ambulatory Visit (HOSPITAL_COMMUNITY)
Admission: RE | Admit: 2019-06-12 | Discharge: 2019-06-12 | Disposition: A | Discharge status: Home / Self Care | Payer: Medicare Other | Source: Ambulatory Visit | Attending: Gynecologic Oncology | Admitting: Gynecologic Oncology

## 2019-06-12 DIAGNOSIS — Z20828 Contact with and (suspected) exposure to other viral communicable diseases: Secondary | ICD-10-CM | POA: Insufficient documentation

## 2019-06-12 DIAGNOSIS — Z01812 Encounter for preprocedural laboratory examination: Secondary | ICD-10-CM | POA: Insufficient documentation

## 2019-06-13 LAB — NOVEL CORONAVIRUS, NAA (HOSP ORDER, SEND-OUT TO REF LAB; TAT 18-24 HRS): SARS-CoV-2, NAA: NOT DETECTED

## 2019-06-13 NOTE — Progress Notes (Signed)
PCP - Nicanor Bake  Cardiologist -   Chest x-ray -  EKG - 05/11/2019 chart and epic Stress Test -  ECHO -  Cardiac Cath -   Sleep Study -  CPAP -   Fasting Blood Sugar - 127 Checks Blood Sugar __2___ times a week  Blood Thinner Instructions: Aspirin Instructions: 81 mg on hold Last Dose: 06/07/2019  Anesthesia review:   Patient denies shortness of breath, fever, cough and chest pain at PAT appointment   Patient verbalized understanding of instructions that were given to them at the PAT appointment. Patient was also instructed that they will need to review over the PAT instructions again at home before surgery.

## 2019-06-13 NOTE — Patient Instructions (Addendum)
DUE TO COVID-19 ONLY ONE VISITOR IS ALLOWED TO COME WITH YOU AND STAY IN THE WAITING ROOM ONLY DURING PRE OP AND PROCEDURE DAY OF SURGERY. THE 1 VISITOR MAY VISIT WITH YOU AFTER SURGERY IN YOUR PRIVATE ROOM DURING VISITING HOURS ONLY!  YOU NEED TO HAVE A COVID 19 TEST ON_Monday 11/30/2020______ @_______ , THIS TEST MUST BE DONE BEFORE SURGERY, COME  [address] [place] , [id number].  ([place]) ONCE YOUR COVID TEST IS COMPLETED, PLEASE BEGIN THE QUARANTINE INSTRUCTIONS AS OUTLINED IN YOUR HANDOUT.                Nicole Sampson    Your procedure is scheduled on: Thursday 06/15/2019   Report to [facility] Main  Entrance    Report to Short Stay at 5:30 AM     Call this number if you have problems the morning of surgery [phone]    Remember: Do not eat food after Midnight.   BRUSH YOUR TEETH MORNING OF SURGERY AND RINSE YOUR MOUTH OUT, NO CHEWING GUM CANDY OR MINTS.  NO SOLID FOOD AFTER MIDNIGHT THE NIGHT PRIOR TO SURGERY. NOTHING BY MOUTH EXCEPT CLEAR LIQUIDS UNTIL 4:30 am.  Eat a light diet the day before surgery.  Examples including soups, broths, toast, yogurt, mashed potatoes.  Things to avoid include carbonated beverages (fizzy beverages), raw fruits and raw vegetables, or beans.   If your bowels are filled with gas, your surgeon will have difficulty visualizing your pelvic organs which increases your surgical risks.   CLEAR LIQUID DIET   Foods Allowed                                                                     Foods Excluded  Coffee and tea, regular and decaf                             liquids that you cannot  Plain Jell-O any favor except red or purple             see through such as: Fruit ices (not with fruit pulp)                                     milk, soups, orange juice  Iced Popsicles                                    All solid food                                    Cranberry, grape and apple juices Sports drinks  like Gatorade Lightly seasoned clear broth or consume(fat free) Sugar, honey syrup  Sample Menu Breakfast                                Lunch  Supper Cranberry juice                    Beef broth                            Chicken broth Jell-O                                     Grape juice                           Apple juice Coffee or tea                        Jell-O                                      Popsicle                                                Coffee or tea                        Coffee or tea  _____________________________________________________________________  How to Manage Your Diabetes Before and After Surgery  Why is it important to control my blood sugar before and after surgery? . Improving blood sugar levels before and after surgery helps healing and can limit problems. . A way of improving blood sugar control is eating a healthy diet by: o  Eating less sugar and carbohydrates o  Increasing activity/exercise o  Talking with your doctor about reaching your blood sugar goals . High blood sugars (greater than 180 mg/dL) can raise your risk of infections and slow your recovery, so you will need to focus on controlling your diabetes during the weeks before surgery. . Make sure that the doctor who takes care of your diabetes knows about your planned surgery including the date and location.  How do I manage my blood sugar before surgery? . Check your blood sugar at least 4 times a day, starting 2 days before surgery, to make sure that the level is not too high or low. o Check your blood sugar the morning of your surgery when you wake up and every 2 hours until you get to the Short Stay unit. . If your blood sugar is less than 70 mg/dL, you will need to treat for low blood sugar: o Do not take insulin. o Treat a low blood sugar (less than 70 mg/dL) with  cup of clear juice (cranberry or apple), 4 glucose tablets, OR glucose  gel. o Recheck blood sugar in 15 minutes after treatment (to make sure it is greater than 70 mg/dL). If your blood sugar is not greater than 70 mg/dL on recheck, call [phone] for further instructions. . Report your blood sugar to the short stay nurse when you get to Short Stay.  . If you are admitted to the hospital after surgery: o Your blood sugar will be checked by the staff and you will probably be given insulin after surgery (instead of oral diabetes medicines) to make sure you have good  blood sugar levels. o The goal for blood sugar control after surgery is 80-180 mg/dL.   WHAT DO I DO ABOUT MY DIABETES MEDICATION?  Marland Kitchen Do not take oral diabetes medicines (pills) the morning of surgery.      Take these medicines the morning of surgery with A SIP OF WATER: NONE   DO NOT TAKE ANY DIABETIC MEDICATIONS DAY OF YOUR SURGERY                               You may not have any metal on your body including hair pins and              piercings  Do not wear jewelry, make-up, lotions, powders or perfumes, deodorant             Do not wear nail polish on your fingernails.  Do not shave  48 hours prior to surgery.             Do not bring valuables to the hospital. [place].  Contacts, dentures or bridgework may not be worn into surgery.  Leave suitcase in the car. After surgery it may be brought to your room.     Patients discharged the day of surgery will not be allowed to drive home. IF YOU ARE HAVING SURGERY AND GOING HOME THE SAME DAY, YOU MUST HAVE AN ADULT TO DRIVE YOU HOME AND  BE WITH YOU FOR 24 HOURS. YOU MAY GO HOME BY TAXI OR UBER OR ORTHERWISE, BUT AN ADULT MUST ACCOMPANY YOU HOME AND STAY WITH YOU FOR 24 HOURS.    Name and phone number of your driver: [name] [phone]                  Please read over the following fact sheets you were  given: _____________________________________________________________________             [facility] - Preparing for Surgery Before surgery, you can play an important role.  Because skin is not sterile, your skin needs to be as free of germs as possible.  You can reduce the number of germs on your skin by washing with CHG (chlorahexidine gluconate) soap before surgery.  CHG is an antiseptic cleaner which kills germs and bonds with the skin to continue killing germs even after washing. Please DO NOT use if you have an allergy to CHG or antibacterial soaps.  If your skin becomes reddened/irritated stop using the CHG and inform your nurse when you arrive at Short Stay. Do not shave (including legs and underarms) for at least 48 hours prior to the first CHG shower.  You may shave your face/neck. Please follow these instructions carefully:  1.  Shower with CHG Soap the night before surgery and the  morning of Surgery.  2.  If you choose to wash your hair, wash your hair first as usual with your  normal  shampoo.  3.  After you shampoo, rinse your hair and body thoroughly to remove the  shampoo.                            4.  Use CHG as you would any other liquid soap.  You can apply chg directly  to the skin and wash  Gently with a scrungie or clean washcloth.  5.  Apply the CHG Soap to your body ONLY FROM THE NECK DOWN.   Do not use on face/ open                           Wound or open sores. Avoid contact with eyes, ears mouth and genitals (private parts).                       Wash face,  Genitals (private parts) with your normal soap.             6.  Wash thoroughly, paying special attention to the area where your surgery  will be performed.  7.  Thoroughly rinse your body with warm water from the neck down.  8.  DO NOT shower/wash with your normal soap after using and rinsing off  the CHG Soap.                9.  Pat yourself dry with a clean towel.            10.  Wear  clean pajamas.            11.  Place clean sheets on your bed the night of your first shower and do not  sleep with pets. Day of Surgery : Do not apply any lotions/deodorants the morning of surgery.  Please wear clean clothes to the hospital/surgery center.  FAILURE TO FOLLOW THESE INSTRUCTIONS MAY RESULT IN THE CANCELLATION OF YOUR SURGERY PATIENT SIGNATURE_________________________________  NURSE SIGNATURE__________________________________  ________________________________________________________________________   Adam Phenix  An incentive spirometer is a tool that can help keep your lungs clear and active. This tool measures how well you are filling your lungs with each breath. Taking long deep breaths may help reverse or decrease the chance of developing breathing (pulmonary) problems (especially infection) following:  A long period of time when you are unable to move or be active. BEFORE THE PROCEDURE   If the spirometer includes an indicator to show your best effort, your nurse or respiratory therapist will set it to a desired goal.  If possible, sit up straight or lean slightly forward. Try not to slouch.  Hold the incentive spirometer in an upright position. INSTRUCTIONS FOR USE  1. Sit on the edge of your bed if possible, or sit up as far as you can in bed or on a chair. 2. Hold the incentive spirometer in an upright position. 3. Breathe out normally. 4. Place the mouthpiece in your mouth and seal your lips tightly around it. 5. Breathe in slowly and as deeply as possible, raising the piston or the ball toward the top of the column. 6. Hold your breath for 3-5 seconds or for as long as possible. Allow the piston or ball to fall to the bottom of the column. 7. Remove the mouthpiece from your mouth and breathe out normally. 8. Rest for a few seconds and repeat Steps 1 through 7 at least 10 times every 1-2 hours when you are awake. Take your time and take a few normal  breaths between deep breaths. 9. The spirometer may include an indicator to show your best effort. Use the indicator as a goal to work toward during each repetition. 10. After each set of 10 deep breaths, practice coughing to be sure your lungs are clear. If you have an incision (the cut made at the time of  surgery), support your incision when coughing by placing a pillow or rolled up towels firmly against it. Once you are able to get out of bed, walk around indoors and cough well. You may stop using the incentive spirometer when instructed by your caregiver.  RISKS AND COMPLICATIONS  Take your time so you do not get dizzy or light-headed.  If you are in pain, you may need to take or ask for pain medication before doing incentive spirometry. It is harder to take a deep breath if you are having pain. AFTER USE  Rest and breathe slowly and easily.  It can be helpful to keep track of a log of your progress. Your caregiver can provide you with a simple table to help with this. If you are using the spirometer at home, follow these instructions: [place] IF:   You are having difficultly using the spirometer.  You have trouble using the spirometer as often as instructed.  Your pain medication is not giving enough relief while using the spirometer.  You develop fever of 100.5 F (38.1 C) or higher. SEEK IMMEDIATE MEDICAL CARE IF:   You cough up bloody sputum that had not been present before.  You develop fever of 102 F (38.9 C) or greater.  You develop worsening pain at or near the incision site. MAKE SURE YOU:   Understand these instructions.  Will watch your condition.  Will get help right away if you are not doing well or get worse. Document Released: 11/09/2006 Document Revised: 09/21/2011 Document Reviewed: 01/10/2007 Exit[employer] Patient Information 2014 Exit[employer], [place].   ________________________________________________________________________  WHAT IS A BLOOD  TRANSFUSION? Blood Transfusion Information  A transfusion is the replacement of blood or some of its parts. Blood is made up of multiple cells which provide different functions.  Red blood cells carry oxygen and are used for blood loss replacement.  White blood cells fight against infection.  Platelets control bleeding.  Plasma helps clot blood.  Other blood products are available for specialized needs, such as hemophilia or other clotting disorders. BEFORE THE TRANSFUSION  Who gives blood for transfusions?   Healthy volunteers who are fully evaluated to make sure their blood is safe. This is blood bank blood. Transfusion therapy is the safest it has ever been in the practice of medicine. Before blood is taken from a donor, a complete history is taken to make sure that person has no history of diseases nor engages in risky social behavior (examples are intravenous drug use or sexual activity with multiple partners). The donor's travel history is screened to minimize risk of transmitting infections, such as malaria. The donated blood is tested for signs of infectious diseases, such as HIV and hepatitis. The blood is then tested to be sure it is compatible with you in order to minimize the chance of a transfusion reaction. If you or a relative donates blood, this is often done in anticipation of surgery and is not appropriate for emergency situations. It takes many days to process the donated blood. RISKS AND COMPLICATIONS Although transfusion therapy is very safe and saves many lives, the main dangers of transfusion include:   Getting an infectious disease.  Developing a transfusion reaction. This is an allergic reaction to something in the blood you were given. Every precaution is taken to prevent this. The decision to have a blood transfusion has been considered carefully by your caregiver before blood is given. Blood is not given unless the benefits outweigh the risks. AFTER THE  TRANSFUSION  Right after receiving a blood transfusion, you will usually feel much better and more energetic. This is especially true if your red blood cells have gotten low (anemic). The transfusion raises the level of the red blood cells which carry oxygen, and this usually causes an energy increase.  The nurse administering the transfusion will monitor you carefully for complications. HOME CARE INSTRUCTIONS  No special instructions are needed after a transfusion. You may find your energy is better. Speak with your caregiver about any limitations on activity for underlying diseases you may have. SEEK MEDICAL CARE IF:   Your condition is not improving after your transfusion.  You develop redness or irritation at the intravenous (IV) site. SEEK IMMEDIATE MEDICAL CARE IF:  Any of the following symptoms occur over the next 12 hours:  Shaking chills.  You have a temperature by mouth above 102 F (38.9 C), not controlled by medicine.  Chest, back, or muscle pain.  People around you feel you are not acting correctly or are confused.  Shortness of breath or difficulty breathing.  Dizziness and fainting.  You get a rash or develop hives.  You have a decrease in urine output.  Your urine turns a dark color or changes to pink, red, or brown. Any of the following symptoms occur over the next 10 days:  You have a temperature by mouth above 102 F (38.9 C), not controlled by medicine.  Shortness of breath.  Weakness after normal activity.  The white part of the eye turns yellow (jaundice).  You have a decrease in the amount of urine or are urinating less often.  Your urine turns a dark color or changes to pink, red, or brown. Document Released: 06/26/2000 Document Revised: 09/21/2011 Document Reviewed: 02/13/2008 [facility] Patient Information 2014 [place], [place].  _______________________________________________________________________

## 2019-06-14 ENCOUNTER — Other Ambulatory Visit: Payer: Self-pay

## 2019-06-14 ENCOUNTER — Encounter (HOSPITAL_COMMUNITY)
Admission: RE | Admit: 2019-06-14 | Discharge: 2019-06-14 | Disposition: A | Discharge status: Home / Self Care | Payer: Medicare Other | Source: Ambulatory Visit | Attending: Gynecologic Oncology | Admitting: Gynecologic Oncology

## 2019-06-14 ENCOUNTER — Encounter (HOSPITAL_COMMUNITY): Payer: Self-pay

## 2019-06-14 ENCOUNTER — Telehealth: Payer: Self-pay

## 2019-06-14 DIAGNOSIS — Z01812 Encounter for preprocedural laboratory examination: Secondary | ICD-10-CM | POA: Insufficient documentation

## 2019-06-14 DIAGNOSIS — E119 Type 2 diabetes mellitus without complications: Secondary | ICD-10-CM | POA: Diagnosis not present

## 2019-06-14 LAB — URINALYSIS, ROUTINE W REFLEX MICROSCOPIC
Bilirubin Urine: NEGATIVE
Glucose, UA: NEGATIVE mg/dL
Ketones, ur: NEGATIVE mg/dL
Nitrite: NEGATIVE
Protein, ur: NEGATIVE mg/dL
Specific Gravity, Urine: 1.009 (ref 1.005–1.030)
pH: 5 (ref 5.0–8.0)

## 2019-06-14 LAB — COMPREHENSIVE METABOLIC PANEL
ALT: 15 U/L (ref 0–44)
AST: 19 U/L (ref 15–41)
Albumin: 3.7 g/dL (ref 3.5–5.0)
Alkaline Phosphatase: 51 U/L (ref 38–126)
Anion gap: 10 (ref 5–15)
BUN: 33 mg/dL — ABNORMAL HIGH (ref 8–23)
CO2: 25 mmol/L (ref 22–32)
Calcium: 9.7 mg/dL (ref 8.9–10.3)
Chloride: 103 mmol/L (ref 98–111)
Creatinine, Ser: 1.21 mg/dL — ABNORMAL HIGH (ref 0.44–1.00)
GFR calc Af Amer: 54 mL/min — ABNORMAL LOW (ref >60)
GFR calc non Af Amer: 47 mL/min — ABNORMAL LOW (ref >60)
Glucose, Bld: 158 mg/dL — ABNORMAL HIGH (ref 70–99)
Potassium: 4 mmol/L (ref 3.5–5.1)
Sodium: 138 mmol/L (ref 135–145)
Total Bilirubin: 0.4 mg/dL (ref 0.3–1.2)
Total Protein: 7.7 g/dL (ref 6.5–8.1)

## 2019-06-14 LAB — HEMOGLOBIN A1C
Hgb A1c MFr Bld: 7.9 % — ABNORMAL HIGH (ref 4.8–5.6)
Mean Plasma Glucose: 180.03 mg/dL

## 2019-06-14 LAB — CBC
HCT: 37.8 % (ref 36.0–46.0)
Hemoglobin: 12 g/dL (ref 12.0–15.0)
MCH: 28.6 pg (ref 26.0–34.0)
MCHC: 31.7 g/dL (ref 30.0–36.0)
MCV: 90 fL (ref 80.0–100.0)
Platelets: 188 10*3/uL (ref 150–400)
RBC: 4.2 MIL/uL (ref 3.87–5.11)
RDW: 14.7 % (ref 11.5–15.5)
WBC: 8.5 10*3/uL (ref 4.0–10.5)
nRBC: 0 % (ref 0.0–0.2)

## 2019-06-14 LAB — ABO/RH: ABO/RH(D): A NEG

## 2019-06-14 LAB — GLUCOSE, CAPILLARY: Glucose-Capillary: 138 mg/dL — ABNORMAL HIGH (ref 70–99)

## 2019-06-14 MED ORDER — DEXTROSE 5 % IV SOLN
3.0000 g | INTRAVENOUS | Status: AC
  Administered 2019-06-15: 3 g via INTRAVENOUS
  Filled 2019-06-14: qty 3

## 2019-06-14 NOTE — Telephone Encounter (Signed)
I spoke with Nicole Sampson, she had not questions regarding surgery tomorrow.

## 2019-06-15 ENCOUNTER — Encounter (HOSPITAL_COMMUNITY): Payer: Self-pay | Admitting: Emergency Medicine

## 2019-06-15 ENCOUNTER — Ambulatory Visit (HOSPITAL_COMMUNITY)
Admission: RE | Admit: 2019-06-15 | Discharge: 2019-06-15 | Disposition: A | Discharge status: Home / Self Care | Payer: Medicare Other | Source: Ambulatory Visit | Attending: Gynecologic Oncology | Admitting: Gynecologic Oncology

## 2019-06-15 ENCOUNTER — Other Ambulatory Visit: Payer: Self-pay

## 2019-06-15 ENCOUNTER — Ambulatory Visit (HOSPITAL_COMMUNITY): Payer: Medicare Other | Admitting: Certified Registered Nurse Anesthetist

## 2019-06-15 ENCOUNTER — Encounter (HOSPITAL_COMMUNITY)
Admission: RE | Disposition: A | Discharge status: Home / Self Care | Payer: Self-pay | Source: Ambulatory Visit | Attending: Gynecologic Oncology

## 2019-06-15 DIAGNOSIS — E1165 Type 2 diabetes mellitus with hyperglycemia: Secondary | ICD-10-CM | POA: Diagnosis not present

## 2019-06-15 DIAGNOSIS — Z91018 Allergy to other foods: Secondary | ICD-10-CM | POA: Diagnosis not present

## 2019-06-15 DIAGNOSIS — M1990 Unspecified osteoarthritis, unspecified site: Secondary | ICD-10-CM | POA: Insufficient documentation

## 2019-06-15 DIAGNOSIS — Z791 Long term (current) use of non-steroidal anti-inflammatories (NSAID): Secondary | ICD-10-CM | POA: Insufficient documentation

## 2019-06-15 DIAGNOSIS — Z833 Family history of diabetes mellitus: Secondary | ICD-10-CM | POA: Diagnosis not present

## 2019-06-15 DIAGNOSIS — Z8249 Family history of ischemic heart disease and other diseases of the circulatory system: Secondary | ICD-10-CM | POA: Diagnosis not present

## 2019-06-15 DIAGNOSIS — C541 Malignant neoplasm of endometrium: Secondary | ICD-10-CM

## 2019-06-15 DIAGNOSIS — Z8049 Family history of malignant neoplasm of other genital organs: Secondary | ICD-10-CM | POA: Insufficient documentation

## 2019-06-15 DIAGNOSIS — Z7984 Long term (current) use of oral hypoglycemic drugs: Secondary | ICD-10-CM | POA: Insufficient documentation

## 2019-06-15 DIAGNOSIS — I10 Essential (primary) hypertension: Secondary | ICD-10-CM | POA: Insufficient documentation

## 2019-06-15 DIAGNOSIS — Z79899 Other long term (current) drug therapy: Secondary | ICD-10-CM | POA: Diagnosis not present

## 2019-06-15 DIAGNOSIS — Z6841 Body Mass Index (BMI) 40.0 and over, adult: Secondary | ICD-10-CM | POA: Diagnosis not present

## 2019-06-15 DIAGNOSIS — Z7982 Long term (current) use of aspirin: Secondary | ICD-10-CM | POA: Insufficient documentation

## 2019-06-15 DIAGNOSIS — D251 Intramural leiomyoma of uterus: Secondary | ICD-10-CM | POA: Diagnosis not present

## 2019-06-15 DIAGNOSIS — E119 Type 2 diabetes mellitus without complications: Secondary | ICD-10-CM | POA: Diagnosis not present

## 2019-06-15 DIAGNOSIS — K219 Gastro-esophageal reflux disease without esophagitis: Secondary | ICD-10-CM | POA: Insufficient documentation

## 2019-06-15 HISTORY — PX: ROBOTIC ASSISTED TOTAL HYSTERECTOMY WITH BILATERAL SALPINGO OOPHERECTOMY: SHX6086

## 2019-06-15 HISTORY — PX: SENTINEL NODE BIOPSY: SHX6608

## 2019-06-15 LAB — TYPE AND SCREEN
ABO/RH(D): A NEG
Antibody Screen: NEGATIVE

## 2019-06-15 LAB — GLUCOSE, CAPILLARY
Glucose-Capillary: 150 mg/dL — ABNORMAL HIGH (ref 70–99)
Glucose-Capillary: 188 mg/dL — ABNORMAL HIGH (ref 70–99)

## 2019-06-15 SURGERY — HYSTERECTOMY, TOTAL, ROBOT-ASSISTED, LAPAROSCOPIC, WITH BILATERAL SALPINGO-OOPHORECTOMY
Anesthesia: General

## 2019-06-15 MED ORDER — FENTANYL CITRATE (PF) 100 MCG/2ML IJ SOLN
INTRAMUSCULAR | Status: AC
  Filled 2019-06-15: qty 2

## 2019-06-15 MED ORDER — EPHEDRINE 5 MG/ML INJ
INTRAVENOUS | Status: AC
  Filled 2019-06-15: qty 10

## 2019-06-15 MED ORDER — LIDOCAINE 2% (20 MG/ML) 5 ML SYRINGE
INTRAMUSCULAR | Status: DC | PRN
  Administered 2019-06-15: 100 mg via INTRAVENOUS

## 2019-06-15 MED ORDER — ONDANSETRON HCL 4 MG/2ML IJ SOLN
INTRAMUSCULAR | Status: DC | PRN
  Administered 2019-06-15 (×2): 4 mg via INTRAVENOUS

## 2019-06-15 MED ORDER — PHENYLEPHRINE 40 MCG/ML (10ML) SYRINGE FOR IV PUSH (FOR BLOOD PRESSURE SUPPORT)
PREFILLED_SYRINGE | INTRAVENOUS | Status: AC
  Filled 2019-06-15: qty 10

## 2019-06-15 MED ORDER — ROCURONIUM BROMIDE 10 MG/ML (PF) SYRINGE
PREFILLED_SYRINGE | INTRAVENOUS | Status: AC
  Filled 2019-06-15: qty 10

## 2019-06-15 MED ORDER — LIDOCAINE 20MG/ML (2%) 15 ML SYRINGE OPTIME
INTRAMUSCULAR | Status: DC | PRN
  Administered 2019-06-15: 1.5 mg/kg/h via INTRAVENOUS

## 2019-06-15 MED ORDER — ACETAMINOPHEN 500 MG PO TABS
1000.0000 mg | ORAL_TABLET | ORAL | Status: AC
  Administered 2019-06-15: 1000 mg via ORAL
  Filled 2019-06-15: qty 2

## 2019-06-15 MED ORDER — MIDAZOLAM HCL 2 MG/2ML IJ SOLN
INTRAMUSCULAR | Status: AC
  Filled 2019-06-15: qty 2

## 2019-06-15 MED ORDER — ROCURONIUM BROMIDE 10 MG/ML (PF) SYRINGE
PREFILLED_SYRINGE | INTRAVENOUS | Status: DC | PRN
  Administered 2019-06-15: 30 mg via INTRAVENOUS
  Administered 2019-06-15: 50 mg via INTRAVENOUS

## 2019-06-15 MED ORDER — BUPIVACAINE HCL (PF) 0.25 % IJ SOLN
INTRAMUSCULAR | Status: AC
  Filled 2019-06-15: qty 30

## 2019-06-15 MED ORDER — STERILE WATER FOR INJECTION IJ SOLN
INTRAMUSCULAR | Status: DC | PRN
  Administered 2019-06-15: 4 mL

## 2019-06-15 MED ORDER — DEXAMETHASONE SODIUM PHOSPHATE 4 MG/ML IJ SOLN
4.0000 mg | INTRAMUSCULAR | Status: AC
  Administered 2019-06-15: 5 mg via INTRAVENOUS

## 2019-06-15 MED ORDER — LABETALOL HCL 5 MG/ML IV SOLN
INTRAVENOUS | Status: DC | PRN
  Administered 2019-06-15: 5 mg via INTRAVENOUS

## 2019-06-15 MED ORDER — LACTATED RINGERS IR SOLN
Status: DC | PRN
  Administered 2019-06-15: 1000 mL

## 2019-06-15 MED ORDER — GABAPENTIN 300 MG PO CAPS
300.0000 mg | ORAL_CAPSULE | ORAL | Status: AC
  Administered 2019-06-15: 300 mg via ORAL
  Filled 2019-06-15: qty 1

## 2019-06-15 MED ORDER — PHENYLEPHRINE 40 MCG/ML (10ML) SYRINGE FOR IV PUSH (FOR BLOOD PRESSURE SUPPORT)
PREFILLED_SYRINGE | INTRAVENOUS | Status: DC | PRN
  Administered 2019-06-15 (×4): 80 ug via INTRAVENOUS

## 2019-06-15 MED ORDER — FENTANYL CITRATE (PF) 100 MCG/2ML IJ SOLN
25.0000 ug | INTRAMUSCULAR | Status: DC | PRN
  Administered 2019-06-15 (×3): 50 ug via INTRAVENOUS

## 2019-06-15 MED ORDER — SODIUM CHLORIDE 0.9% FLUSH: 3.0000 mL | Freq: Two times a day (BID) | INTRAVENOUS | Status: DC

## 2019-06-15 MED ORDER — ACETAMINOPHEN 325 MG PO TABS: 650.0000 mg | ORAL_TABLET | ORAL | Status: DC | PRN

## 2019-06-15 MED ORDER — PROPOFOL 10 MG/ML IV BOLUS
INTRAVENOUS | Status: DC | PRN
  Administered 2019-06-15: 130 mg via INTRAVENOUS

## 2019-06-15 MED ORDER — BUPIVACAINE LIPOSOME 1.3 % IJ SUSP
20.0000 mL | Freq: Once | INTRAMUSCULAR | Status: AC
  Administered 2019-06-15: 20 mL
  Filled 2019-06-15: qty 20

## 2019-06-15 MED ORDER — EPHEDRINE SULFATE-NACL 50-0.9 MG/10ML-% IV SOSY
PREFILLED_SYRINGE | INTRAVENOUS | Status: DC | PRN
  Administered 2019-06-15: 5 mg via INTRAVENOUS
  Administered 2019-06-15: 10 mg via INTRAVENOUS

## 2019-06-15 MED ORDER — LABETALOL HCL 5 MG/ML IV SOLN
INTRAVENOUS | Status: AC
  Filled 2019-06-15: qty 4

## 2019-06-15 MED ORDER — SODIUM CHLORIDE 0.9 % IV SOLN: 250.0000 mL | INTRAVENOUS | Status: DC | PRN

## 2019-06-15 MED ORDER — SODIUM CHLORIDE 0.9% FLUSH: 3.0000 mL | INTRAVENOUS | Status: DC | PRN

## 2019-06-15 MED ORDER — LIDOCAINE 2% (20 MG/ML) 5 ML SYRINGE
INTRAMUSCULAR | Status: AC
  Filled 2019-06-15: qty 5

## 2019-06-15 MED ORDER — ENOXAPARIN SODIUM 40 MG/0.4ML ~~LOC~~ SOLN
40.0000 mg | SUBCUTANEOUS | Status: AC
  Administered 2019-06-15: 40 mg via SUBCUTANEOUS
  Filled 2019-06-15: qty 0.4

## 2019-06-15 MED ORDER — OXYCODONE HCL 5 MG PO TABS
ORAL_TABLET | ORAL | Status: AC
  Filled 2019-06-15: qty 1

## 2019-06-15 MED ORDER — MORPHINE SULFATE (PF) 4 MG/ML IV SOLN: 2.0000 mg | INTRAVENOUS | Status: DC | PRN

## 2019-06-15 MED ORDER — SUGAMMADEX SODIUM 200 MG/2ML IV SOLN
INTRAVENOUS | Status: DC | PRN
  Administered 2019-06-15: 300 mg via INTRAVENOUS

## 2019-06-15 MED ORDER — ACETAMINOPHEN 650 MG RE SUPP: 650.0000 mg | RECTAL | Status: DC | PRN

## 2019-06-15 MED ORDER — DEXAMETHASONE SODIUM PHOSPHATE 10 MG/ML IJ SOLN
INTRAMUSCULAR | Status: AC
  Filled 2019-06-15: qty 1

## 2019-06-15 MED ORDER — OXYCODONE HCL 5 MG/5ML PO SOLN: 5.0000 mg | Freq: Once | ORAL | Status: AC | PRN

## 2019-06-15 MED ORDER — BUPIVACAINE HCL 0.25 % IJ SOLN
INTRAMUSCULAR | Status: DC | PRN
  Administered 2019-06-15: 30 mL

## 2019-06-15 MED ORDER — LACTATED RINGERS IV SOLN
INTRAVENOUS | Status: DC
  Administered 2019-06-15 (×2): via INTRAVENOUS

## 2019-06-15 MED ORDER — STERILE WATER FOR INJECTION IJ SOLN
INTRAMUSCULAR | Status: AC
  Filled 2019-06-15: qty 10

## 2019-06-15 MED ORDER — SCOPOLAMINE 1 MG/3DAYS TD PT72
1.0000 | MEDICATED_PATCH | TRANSDERMAL | Status: DC
  Administered 2019-06-15: 1.5 mg via TRANSDERMAL
  Filled 2019-06-15: qty 1

## 2019-06-15 MED ORDER — ONDANSETRON HCL 4 MG/2ML IJ SOLN
INTRAMUSCULAR | Status: AC
  Filled 2019-06-15: qty 2

## 2019-06-15 MED ORDER — OXYCODONE HCL 5 MG PO TABS: 5.0000 mg | ORAL_TABLET | ORAL | Status: DC | PRN

## 2019-06-15 MED ORDER — ONDANSETRON HCL 4 MG/2ML IJ SOLN: 4.0000 mg | Freq: Once | INTRAMUSCULAR | Status: DC | PRN

## 2019-06-15 MED ORDER — PROPOFOL 10 MG/ML IV BOLUS
INTRAVENOUS | Status: AC
  Filled 2019-06-15: qty 20

## 2019-06-15 MED ORDER — OXYCODONE HCL 5 MG PO TABS
5.0000 mg | ORAL_TABLET | Freq: Once | ORAL | Status: AC | PRN
  Administered 2019-06-15: 5 mg via ORAL

## 2019-06-15 MED ORDER — MIDAZOLAM HCL 5 MG/5ML IJ SOLN
INTRAMUSCULAR | Status: DC | PRN
  Administered 2019-06-15: 2 mg via INTRAVENOUS

## 2019-06-15 MED ORDER — FENTANYL CITRATE (PF) 100 MCG/2ML IJ SOLN
INTRAMUSCULAR | Status: DC | PRN
  Administered 2019-06-15 (×3): 50 ug via INTRAVENOUS
  Administered 2019-06-15: 100 ug via INTRAVENOUS
  Administered 2019-06-15: 50 ug via INTRAVENOUS

## 2019-06-15 MED ORDER — INDOCYANINE GREEN 25 MG IV SOLR
INTRAVENOUS | Status: DC | PRN
  Administered 2019-06-15: 2.5 mg

## 2019-06-15 SURGICAL SUPPLY — 69 items
ADH SKN CLS APL DERMABOND .7 (GAUZE/BANDAGES/DRESSINGS) ×2
AGENT HMST KT MTR STRL THRMB (HEMOSTASIS)
APL ESCP 34 STRL LF DISP (HEMOSTASIS)
APPLICATOR SURGIFLO ENDO (HEMOSTASIS) IMPLANT
BAG LAPAROSCOPIC 12 15 PORT 16 (BASKET) IMPLANT
BAG RETRIEVAL 12/15 (BASKET) ×3
BAG SPEC RTRVL LRG 6X4 10 (ENDOMECHANICALS)
BLADE SURG SZ10 CARB STEEL (BLADE) ×1 IMPLANT
COVER BACK TABLE 60X90IN (DRAPES) ×3 IMPLANT
COVER TIP SHEARS 8 DVNC (MISCELLANEOUS) ×2 IMPLANT
COVER TIP SHEARS 8MM DA VINCI (MISCELLANEOUS) ×1
COVER WAND RF STERILE (DRAPES) IMPLANT
DECANTER SPIKE VIAL GLASS SM (MISCELLANEOUS) ×2 IMPLANT
DERMABOND ADVANCED (GAUZE/BANDAGES/DRESSINGS) ×1
DERMABOND ADVANCED .7 DNX12 (GAUZE/BANDAGES/DRESSINGS) ×2 IMPLANT
DRAPE ARM DVNC X/XI (DISPOSABLE) ×8 IMPLANT
DRAPE COLUMN DVNC XI (DISPOSABLE) ×2 IMPLANT
DRAPE DA VINCI XI ARM (DISPOSABLE) ×4
DRAPE DA VINCI XI COLUMN (DISPOSABLE) ×1
DRAPE SHEET LG 3/4 BI-LAMINATE (DRAPES) ×3 IMPLANT
DRAPE SURG IRRIG POUCH 19X23 (DRAPES) ×3 IMPLANT
DRSG OPSITE POSTOP 4X6 (GAUZE/BANDAGES/DRESSINGS) ×1 IMPLANT
DRSG OPSITE POSTOP 4X8 (GAUZE/BANDAGES/DRESSINGS) IMPLANT
ELECT REM PT RETURN 15FT ADLT (MISCELLANEOUS) ×3 IMPLANT
GLOVE BIO SURGEON STRL SZ 6 (GLOVE) ×12 IMPLANT
GLOVE BIO SURGEON STRL SZ 6.5 (GLOVE) ×2 IMPLANT
GOWN STRL REUS W/ TWL LRG LVL3 (GOWN DISPOSABLE) ×8 IMPLANT
GOWN STRL REUS W/TWL LRG LVL3 (GOWN DISPOSABLE) ×12
HOLDER FOLEY CATH W/STRAP (MISCELLANEOUS) ×2 IMPLANT
IRRIG SUCT STRYKERFLOW 2 WTIP (MISCELLANEOUS) ×3
IRRIGATION SUCT STRKRFLW 2 WTP (MISCELLANEOUS) ×2 IMPLANT
KIT PROCEDURE DA VINCI SI (MISCELLANEOUS) ×1
KIT PROCEDURE DVNC SI (MISCELLANEOUS) IMPLANT
KIT TURNOVER KIT A (KITS) ×1 IMPLANT
MANIPULATOR UTERINE 4.5 ZUMI (MISCELLANEOUS) ×3 IMPLANT
NDL SPNL 18GX3.5 QUINCKE PK (NEEDLE) IMPLANT
NEEDLE HYPO 22GX1.5 SAFETY (NEEDLE) ×1 IMPLANT
NEEDLE SPNL 18GX3.5 QUINCKE PK (NEEDLE) ×3 IMPLANT
OBTURATOR OPTICAL STANDARD 8MM (TROCAR) ×1
OBTURATOR OPTICAL STND 8 DVNC (TROCAR) ×2
OBTURATOR OPTICALSTD 8 DVNC (TROCAR) ×2 IMPLANT
PACK ROBOT GYN CUSTOM WL (TRAY / TRAY PROCEDURE) ×3 IMPLANT
PAD POSITIONING PINK XL (MISCELLANEOUS) ×3 IMPLANT
PENCIL SMOKE EVACUATOR (MISCELLANEOUS) ×1 IMPLANT
PORT ACCESS TROCAR AIRSEAL 12 (TROCAR) ×2 IMPLANT
PORT ACCESS TROCAR AIRSEAL 5M (TROCAR) ×1
POUCH SPECIMEN RETRIEVAL 10MM (ENDOMECHANICALS) IMPLANT
SEAL CANN UNIV 5-8 DVNC XI (MISCELLANEOUS) ×6 IMPLANT
SEAL XI 5MM-8MM UNIVERSAL (MISCELLANEOUS) ×3
SET TRI-LUMEN FLTR TB AIRSEAL (TUBING) ×3 IMPLANT
SPONGE LAP 18X18 X RAY DECT (DISPOSABLE) ×1 IMPLANT
SURGIFLO W/THROMBIN 8M KIT (HEMOSTASIS) IMPLANT
SUT MNCRL AB 4-0 PS2 18 (SUTURE) ×2 IMPLANT
SUT PDS AB 1 TP1 96 (SUTURE) ×2 IMPLANT
SUT VIC AB 0 CT1 27 (SUTURE)
SUT VIC AB 0 CT1 27XBRD ANTBC (SUTURE) IMPLANT
SUT VIC AB 2-0 CT1 27 (SUTURE) ×6
SUT VIC AB 2-0 CT1 TAPERPNT 27 (SUTURE) IMPLANT
SUT VIC AB 3-0 CTX 36 (SUTURE) ×1 IMPLANT
SUT VIC AB 4-0 PS2 27 (SUTURE) ×2 IMPLANT
SUT VICRYL 4-0 PS2 18IN ABS (SUTURE) ×6 IMPLANT
SYR 10ML LL (SYRINGE) ×1 IMPLANT
TOWEL OR NON WOVEN STRL DISP B (DISPOSABLE) ×3 IMPLANT
TRAP SPECIMEN MUCOUS 40CC (MISCELLANEOUS) IMPLANT
TRAY FOLEY MTR SLVR 16FR STAT (SET/KITS/TRAYS/PACK) ×3 IMPLANT
TROCAR XCEL NON-BLD 5MMX100MML (ENDOMECHANICALS) IMPLANT
UNDERPAD 30X36 HEAVY ABSORB (UNDERPADS AND DIAPERS) ×3 IMPLANT
WATER STERILE IRR 1000ML POUR (IV SOLUTION) ×3 IMPLANT
YANKAUER SUCT BULB TIP 10FT TU (MISCELLANEOUS) ×1 IMPLANT

## 2019-06-15 NOTE — Interval H&P Note (Signed)
History and Physical Interval Note:  06/15/2019 7:10 AM  Nicole Sampson  has presented today for surgery, with the diagnosis of ENDOMETRIAL CANCER.  The various methods of treatment have been discussed with the patient and family. After consideration of risks, benefits and other options for treatment, the patient has consented to  Procedure(s): XI ROBOTIC ASSISTED TOTAL HYSTERECTOMY WITH BILATERAL SALPINGO OOPHORECTOMY (Bilateral) SENTINEL NODE BIOPSY (N/A) as a surgical intervention.  The patient's history has been reviewed, patient examined, no change in status, stable for surgery.  I have reviewed the patient's chart and labs.  Questions were answered to the patient's satisfaction.     Thereasa Solo

## 2019-06-15 NOTE — Transfer of Care (Signed)
Immediate Anesthesia Transfer of Care Note  Patient: ALIXANDREA STANCATO  Procedure(s) Performed: XI ROBOTIC ASSISTED TOTAL HYSTERECTOMY GREATER THAN 250 GRAMS, WITH BILATERAL SALPINGO OOPHORECTOMY (Bilateral ) SENTINEL NODE BIOPSY (N/A )  Patient Location: PACU  Anesthesia Type:General  Level of Consciousness: awake, alert , oriented and patient cooperative  Airway & Oxygen Therapy: Patient Spontanous Breathing and Patient connected to face mask  Post-op Assessment: Report given to RN and Post -op Vital signs reviewed and stable  Post vital signs: Reviewed and stable  Last Vitals:  Vitals Value Taken Time  BP 149/90 06/15/19 1012  Temp    Pulse 72 06/15/19 1013  Resp 14 06/15/19 1013  SpO2 100 % 06/15/19 1013  Vitals shown include unvalidated device data.  Last Pain:  Vitals:   06/15/19 0618  TempSrc:   PainSc: 0-No pain      Patients Stated Pain Goal: 4 ([id number])  Complications: No apparent anesthesia complications

## 2019-06-15 NOTE — Anesthesia Preprocedure Evaluation (Signed)
Anesthesia Evaluation  Patient identified by MRN, date of birth, ID band Patient awake    Reviewed: Allergy & Precautions, NPO status , Patient's Chart, lab work & pertinent test results  History of Anesthesia Complications Negative for: history of anesthetic complications  Airway Mallampati: II  TM Distance: >3 FB Neck ROM: Full    Dental  (+) Upper Dentures, Lower Dentures   Pulmonary neg pulmonary ROS,    Pulmonary exam normal        Cardiovascular hypertension, Pt. on medications Normal cardiovascular exam     Neuro/Psych negative neurological ROS  negative psych ROS   GI/Hepatic Neg liver ROS, GERD  ,  Endo/Other  diabetes, Poorly Controlled, Type 2, Oral Hypoglycemic AgentsMorbid obesity  Renal/GU Renal InsufficiencyRenal disease  Female GU complaint (endometrial cancer)     Musculoskeletal  (+) Arthritis ,   Abdominal   Peds  Hematology negative hematology ROS (+)   Anesthesia Other Findings   Reproductive/Obstetrics                            Anesthesia Physical Anesthesia Plan  ASA: III  Anesthesia Plan: General   Post-op Pain Management:    Induction: Intravenous  PONV Risk Score and Plan: 4 or greater and Ondansetron, Dexamethasone, Treatment may vary due to age or medical condition and Midazolam  Airway Management Planned: Oral ETT  Additional Equipment: None  Intra-op Plan:   Post-operative Plan: Extubation in OR  Informed Consent: I have reviewed the patients History and Physical, chart, labs and discussed the procedure including the risks, benefits and alternatives for the proposed anesthesia with the patient or authorized representative who has indicated his/her understanding and acceptance.     Dental advisory given  Plan Discussed with:   Anesthesia Plan Comments:        Anesthesia Quick Evaluation

## 2019-06-15 NOTE — Op Note (Signed)
OPERATIVE NOTE 06/15/19  Surgeon: Donaciano Eva   Assistants: Dr Lahoma Crocker (an MD assistant was necessary for tissue manipulation, management of robotic instrumentation, retraction and positioning due to the complexity of the case and hospital policies).   Anesthesia: General endotracheal anesthesia  ASA Class: 3   Pre-operative Diagnosis: endometrial cancer grade 1, class 3 morbid obesity (BMI 45kg/m2)  Post-operative Diagnosis: same,   Operation: Robotic-assisted laparoscopic total hysterectomy with bilateral salpingoophorectomy, SLN biopsy, minilaparotomy for specimen delivery.   Extreme morbid obesity requiring additional OR personnel for positioning and retraction. Obesity made retroperitoneal visualization limited and increased the complexity of the case and necessitated additional instrumentation for retraction. Obesity related complexity increased the duration of the procedure by 60 minutes.   Surgeon: Donaciano Eva  Assistant Surgeon: Lahoma Crocker MD  Anesthesia: GET  Urine Output: 100cc  Operative Findings:  : 10cm bulky retroverted uterus, slightly globular ovaries but no discrete masses. Narrow vagina. No gross extrauterine disease. Extreme obesity, BMI 45kg/m2, with significant retroperitoneal and intraperitoneal adiposity. Obesity necessitated an additional 60 minutes of operating time to create safe exposure. Obesity required additional personnel in the operating room for positioning and retraction. Severe obesity substantially increased the complexity of the procedure.   Estimated Blood Loss:  20cc      Total IV Fluids: 800 ml         Specimens: uterus, cervix, bilateral tubes and ovaries, right presacral SLN, left external iliac SLN         Complications:  None; patient tolerated the procedure well.         Disposition: PACU - hemodynamically stable.  Procedure Details  The patient was seen in the Holding Room. The risks, benefits,  complications, treatment options, and expected outcomes were discussed with the patient.  The patient concurred with the proposed plan, giving informed consent.  The site of surgery properly noted/marked. The patient was identified as Nicole Sampson and the procedure verified as a Robotic-assisted hysterectomy with bilateral salpingo oophorectomy with SLN biopsy. A Time Out was held and the above information confirmed.  After induction of anesthesia, the patient was draped and prepped in the usual sterile manner. Pt was placed in supine position after anesthesia and draped and prepped in the usual sterile manner. The abdominal drape was placed after the CholoraPrep had been allowed to dry for 3 minutes.  Her arms were tucked to her side with all appropriate precautions.  The shoulders were stabilized with padded shoulder blocks applied to the acromium processes.  The patient was placed in the semi-lithotomy position in [place].  The perineum was prepped with Betadine. The patient was then prepped. Foley catheter was placed.  A sterile speculum was placed in the vagina.  The cervix was grasped with a single-tooth tenaculum. 2mg  total of ICG was injected into the cervical stroma at 2 and 9 o'clock with 1cc injected at a 1cm and 56mm depth (concentration 0.5mg /ml) in all locations. The cervix was dilated with Kennon Rounds dilators.  The ZUMI uterine manipulator with a medium colpotomizer ring was placed without difficulty.  A pneum occluder balloon was placed over the manipulator.  OG tube placement was confirmed and to suction.   Next, a 5 mm skin incision was made 1 cm below the subcostal margin in the midclavicular line.  The 5 mm Optiview port and scope was used for direct entry.  Opening pressure was under 10 mm CO2.  The abdomen was insufflated and the findings were noted as above.  At this point and all points during the procedure, the patient's intra-abdominal pressure did not exceed 15 mmHg. Next, a 10 mm  skin incision was made 5cm above the umbilicus and a right and left port was placed about 10 cm lateral to the robot port on the right and left side.  A fourth arm was placed in the left lower quadrant 2 cm above and superior and medial to the anterior superior iliac spine.  All ports were placed under direct visualization.  The patient was placed in steep Trendelenburg.  Bowel was folded away into the upper abdomen.  The robot was docked in the normal manner.  The right and left peritoneum were opened parallel to the IP ligament to open the retroperitoneal spaces bilaterally. The SLN mapping was performed in bilateral pelvic basins. The para rectal and paravesical spaces were opened up entirely with careful dissection below the level of the ureters bilaterally and to the depth of the uterine artery origin in order to skeletonize the uterine "web" and ensure visualization of all parametrial channels. The para-aortic basins were carefully exposed and evaluated for isolated para-aortic SLN's. Lymphatic channels were identified travelling to the following visualized sentinel lymph node's: left presacral and right external iliac. These SLN's were separated from their surrounding lymphatic tissue, removed and sent for permanent pathology.  The hysterectomy was started after the round ligament on the right side was incised and the retroperitoneum was entered and the pararectal space was developed.  The ureter was noted to be on the medial leaf of the broad ligament.  The peritoneum above the ureter was incised and stretched and the infundibulopelvic ligament was skeletonized, cauterized and cut.  The posterior peritoneum was taken down to the level of the KOH ring.  The anterior peritoneum was also taken down.  The bladder flap was created to the level of the KOH ring.  The uterine artery on the right side was skeletonized, cauterized and cut in the normal manner.  A similar procedure was performed on the left.  The  colpotomy was made and the uterus, cervix, bilateral ovaries and tubes were amputated but the specimen could not be delivered through the vagina due to its size.  Pedicles were inspected and excellent hemostasis was achieved.    The colpotomy at the vaginal cuff was closed with Vicryl on a CT1 needle in a running manner.  Irrigation was used and excellent hemostasis was achieved.  At this point in the procedure was completed.  Robotic instruments were removed under direct visulaization.  The robot was undocked.   A 6cm supraumbilical transverse incision was made with the scalpel at the site of her midline incision. The subcutaneous skin was opened with the bovie. The fascia was opened with the bovie vertically. The peritoneum was opened sharply in the midline. The peritoneal incision was extended. The uterine specimen in the endocatch bag was retrieved through the incision. The fascia was closed with runnin looped #1 PDS. The subcutaneous fat was closed with 3-0 vicryl. 20cc of exparel mixed with 20cc of marcaine was infiltrated into the incision. The incision was closed at the skin with monocryl and dermabond. The 10 mm ports were closed with Vicryl on a UR-5 needle and the fascia was closed with 0 Vicryl on a UR-5 needle.  The skin was closed with 4-0 Vicryl in a subcuticular manner.  Dermabond was applied.  Sponge, lap and needle counts correct x 2.  The patient was taken to the recovery room in stable  condition.  The vagina was swabbed with  minimal bleeding noted.   All instrument and needle counts were correct x  3.   The patient was transferred to the recovery room in a stable condition.  Donaciano Eva, MD

## 2019-06-15 NOTE — Anesthesia Procedure Notes (Signed)
Procedure Name: Intubation Date/Time: 06/15/2019 7:34 AM Performed by: Claudia Desanctis, CRNA Pre-anesthesia Checklist: Patient identified, Emergency Drugs available, Suction available and Patient being monitored Patient Re-evaluated:Patient Re-evaluated prior to induction Oxygen Delivery Method: Circle system utilized Preoxygenation: Pre-oxygenation with 100% oxygen Induction Type: IV induction Ventilation: Mask ventilation without difficulty Laryngoscope Size: 2 and Miller Grade View: Grade I Tube type: Oral Tube size: 7.0 mm Number of attempts: 1 Airway Equipment and Method: Stylet Placement Confirmation: ETT inserted through vocal cords under direct vision,  positive ETCO2 and breath sounds checked- equal and bilateral Secured at: 22 cm Tube secured with: Tape Dental Injury: Teeth and Oropharynx as per pre-operative assessment

## 2019-06-15 NOTE — Discharge Instructions (Signed)
Return to work: 4 weeks (2 weeks with physical restrictions).  Activity: 1. Be up and out of the bed during the day.  Take a nap if needed.  You may walk up steps but be careful and use the hand rail.  Stair climbing will tire you more than you think, you may need to stop part way and rest.   2. No lifting or straining for 4 weeks.  3. No driving for 1 weeks.  Do Not drive if you are taking narcotic pain medicine.  4. Shower daily.  Use soap and water on your incision and pat dry; don't rub.   5. No sexual activity and nothing in the vagina for 8 weeks.  Medications:  - Take ibuprofen and tylenol first line for pain control. Take these regularly (every 6 hours) to decrease the build up of pain.  - If necessary, for severe pain not relieved by ibuprofen, contact Dr Rossi's office and you will be prescribed percocet.  - While taking percocet you should take sennakot every night to reduce the likelihood of constipation. If this causes diarrhea, stop its use.  Diet: 1. Low sodium Heart Healthy Diet is recommended.  2. It is safe to use a laxative if you have difficulty moving your bowels.   Wound Care: 1. Keep clean and dry.  Shower daily.  Reasons to call the Doctor:   Fever - Oral temperature greater than 100.4 degrees Fahrenheit  Foul-smelling vaginal discharge  Difficulty urinating  Nausea and vomiting  Increased pain at the site of the incision that is unrelieved with pain medicine.  Difficulty breathing with or without chest pain  New calf pain especially if only on one side  Sudden, continuing increased vaginal bleeding with or without clots.   Follow-up: 1. See Emma Rossi in 4 weeks.  Contacts: For questions or concerns you should contact:  Dr. Emma Rossi at [phone] After hours and on week-ends call [phone] and ask to speak to the physician on call for Gynecologic Oncology  After Your Surgery  The information in this section will tell you what  to expect after your surgery, both during your stay and after you leave. You will learn how to safely recover from your surgery. Write down any questions you have and be sure to ask your doctor or nurse.  What to Expect When you wake up after your surgery, you will be in the Post-Anesthesia Care Unit (PACU) or your recovery room. A nurse will be monitoring your body temperature, blood pressure, pulse, and oxygen levels. You may have a urinary catheter in your bladder to help monitor the amount of urine you are making. It should come out before you go home. You will also have compression boots on your lower legs to help your circulation. Your pain medication will be given through an IV line or in tablet form. If you are having pain, tell your nurse. Your nurse will tell you how to recover from your surgery. Below are examples of ways you can help yourself recover safely. . You will be encouraged to walk with the help of your nurse or physical therapist. We will give you medication to relieve pain. Walking helps reduce the risk for blood clots and pneumonia. It also helps to stimulate your bowels so they begin working again. . Use your incentive spirometer. This will help your lungs expand, which prevents pneumonia.   Commonly Asked Questions  Will I have pain after surgery? Yes, you will have some   pain after your surgery, especially in the first few days. Your doctor and nurse will ask you about your pain often. You will be given medication to manage your pain as needed. If your pain is not relieved, please tell your doctor or nurse. It is important to control your pain so you can cough, breathe deeply, use your incentive spirometer, and get out of bed and walk.  Will I be able to eat? Yes, you will be able to eat a regular diet or eat as tolerated. You should start with foods that are soft and easy to digest such as apple sauce and chicken noodle soup. Eat small meals frequently, and then advance  to regular foods. If you experience bloating, gas, or cramps, limit high-fiber foods, including whole grain breads and cereal, nuts, seeds, salads, fresh fruit, broccoli, cabbage, and cauliflower. Will I have pain when I am home? The length of time each person has pain or discomfort varies. You may still have some pain when you go home and will probably be taking pain medication. Follow the guidelines below. . Take your medications as directed and as needed. . Call your doctor if the medication prescribed for you doesn't relieve your pain. . Don't drive or drink alcohol while you're taking prescription pain medication. . As your incision heals, you will have less pain and need less pain medication. A mild pain reliever such as acetaminophen (Tylenol) or ibuprofen (Advil) will relieve aches and discomfort. However, large quantities of acetaminophen may be harmful to your liver. Don't take more acetaminophen than the amount directed on the bottle or as instructed by your doctor or nurse. . Pain medication should help you as you resume your normal activities. Take enough medication to do your exercises comfortably. Pain medication is most effective 30 to 45 minutes after taking it. . Keep track of when you take your pain medication. Taking it when your pain first begins is more effective than waiting for the pain to get worse. Pain medication may cause constipation (having fewer bowel movements than what is normal for you).  How can I prevent constipation? . Go to the bathroom at the same time every day. Your body will get used to going at that time. . If you feel the urge to go, don't put it off. Try to use the bathroom 5 to 15 minutes after meals. . After breakfast is a good time to move your bowels. The reflexes in your colon are strongest at this time. . Exercise, if you can. Walking is an excellent form of exercise. . Drink 8 (8-ounce) glasses (2 liters) of liquids daily, if you can. Drink  water, juices, soups, ice cream shakes, and other drinks that don't have caffeine. Drinks with caffeine, such as coffee and soda, pull fluid out of the body. . Slowly increase the fiber in your diet to 25 to 35 grams per day. Fruits, vegetables, whole grains, and cereals contain fiber. If you have an ostomy or have had recent bowel surgery, check with your doctor or nurse before making any changes in your diet. . Both over-the-counter and prescription medications are available to treat constipation. Start with 1 of the following over-the-counter medications first: o Docusate sodium (Colace) 100 mg. Take ___1__ capsules _2____ times a day. This is a stool softener that causes few side effects. Don't take it with mineral oil. o Polyethylene glycol (MiraLAX) 17 grams daily. o Senna (Senokot) 2 tablets at bedtime. This is a stimulant laxative, which can cause cramping. .   If you haven't had a bowel movement in 2 days, call your doctor or nurse.  Can I shower? Yes, you should shower 24 hours after your surgery. Be sure to shower every day. Taking a warm shower is relaxing and can help decrease muscle aches. Use soap when you shower and gently wash your incision. Pat the areas dry with a towel after showering, and leave your incision uncovered (unless there is drainage). Call your doctor if you see any redness or drainage from your incision. Don't take tub baths until you discuss it with your doctor at the first appointment after your surgery. How do I care for my incisions? You will have several small incisions on your abdomen. The incisions are closed with Steri-Strips or Dermabond. You may also have square white dressings on your incisions ([place]). You can remove these in the shower 24 hours after your surgery. You should clean your incisions with soap and water. If you go home with Steri-Strips on your incision, they will loosen and may fall off by themselves. If they haven't fallen off within 10  days, you can remove them. If you go home with Dermabond over your sutures (stitches), it will also loosen and peel off.  What are the most common symptoms after a hysterectomy? It's common for you to have some vaginal spotting or light bleeding. You should monitor this with a pad or a panty liner. If you have having heavy bleeding (bleeding through a pad or liner every 1 to 2 hours), call your doctor right away. It's also common to have some discomfort after surgery from the air that was pumped into your abdomen during surgery. To help with this, walk, drink plenty of liquids and make sure to take the stool softeners you received.  When is it safe for me to drive? You may resume driving 2 weeks after surgery, as long as you are not taking pain medication that may make you drowsy.  When can I resume sexual activity? Do not place anything in your vagina or have vaginal intercourse for 8 weeks after your surgery. Some people will need to wait longer than 8 weeks, so speak with your doctor before resuming sexual intercourse.  Will I be able to travel? Yes, you can travel. If you are traveling by plane within a few weeks after your surgery, make sure you get up and walk every hour. Be sure to stretch your legs, drink plenty of liquids, and keep your feet elevated when possible.  Will I need any supplies? Most people do not need any supplies after the surgery. In the rare case that you do need supplies, such as tubes or drains, your nurse will order them for you.  When can I return to work? The time it takes to return to work depends on the type of work you do, the type of surgery you had, and how fast your body heals. Most people can return to work about 2 to 4 weeks after the surgery.  What exercises can I do? Exercise will help you gain strength and feel better. Walking and stair climbing are excellent forms of exercise. Gradually increase the distance you walk. Climb stairs slowly, resting or  stopping as needed. Ask your doctor or nurse before starting more strenuous exercises.  When can I lift heavy objects? Most people should not lift anything heavier than 10 pounds (4.5 kilograms) for at least 4 weeks after surgery. Speak with your doctor about when you can do heavy lifting.    How can I cope with my feelings? After surgery for a serious illness, you may have new and upsetting feelings. Many people say they felt weepy, sad, worried, nervous, irritable, and angry at one time or another. You may find that you can't control some of these feelings. If this happens, it's a good idea to seek emotional support. The first step in coping is to talk about how you feel. Family and friends can help. Your nurse, doctor, and social worker can reassure, support, and guide you. It's always a good idea to let these professionals know how you, your family, and your friends are feeling emotionally. Many resources are available to patients and their families. Whether you're in the hospital or at home, the nurses, doctors, and social workers are here to help you and your family and friends handle the emotional aspects of your illness.  When is my first appointment after surgery? Your first appointment after surgery will be 2 to 4 weeks after surgery. Your nurse will give you instructions on how to make this appointment, including the phone number to call.  What if I have other questions? If you have any questions or concerns, please talk with your doctor or nurse. You can reach them Monday through Friday from 9:00 am to 5:00 pm. After 5:00 pm, during the weekend, and on holidays, call [phone] and ask for the doctor on call for your doctor.  . Have a temperature of 101 F (38.3 C) or higher . Have pain that does not get better with pain medication . Have redness, drainage, or swelling from your incisions 

## 2019-06-16 ENCOUNTER — Encounter (HOSPITAL_COMMUNITY): Payer: Self-pay | Admitting: Gynecologic Oncology

## 2019-06-16 ENCOUNTER — Telehealth: Payer: Self-pay

## 2019-06-16 NOTE — Anesthesia Postprocedure Evaluation (Signed)
Anesthesia Post Note  Patient: Nicole Sampson  Procedure(s) Performed: XI ROBOTIC ASSISTED TOTAL HYSTERECTOMY GREATER THAN 250 GRAMS, WITH BILATERAL SALPINGO OOPHORECTOMY (Bilateral ) SENTINEL NODE BIOPSY (N/A )     Patient location during evaluation: PACU Anesthesia Type: General Level of consciousness: awake and alert Pain management: pain level controlled Vital Signs Assessment: post-procedure vital signs reviewed and stable Respiratory status: spontaneous breathing, nonlabored ventilation and respiratory function stable Cardiovascular status: blood pressure returned to baseline and stable Postop Assessment: no apparent nausea or vomiting Anesthetic complications: no    Last Vitals:  Vitals:   06/15/19 1120 06/15/19 1200  BP: 115/69 117/72  Pulse: 66 65  Resp: 12 14  Temp: (!) 36.3 C (!) 36.3 C  SpO2: 97% 96%    Last Pain:  Vitals:   06/15/19 1200  TempSrc:   PainSc: 3                  Lidia Collum

## 2019-06-16 NOTE — Telephone Encounter (Signed)
Nicole Sampson states that she is doing well. Abdomen a little sore. She is alternating ibuprofen and tylenol. She has not needed to use the tramadol. She is eating, drinking, and urinating well. She is passing gas. No BM thus far.  Did take senokot-s as directed last night. Incisions and dressings are D&I. Instructed patient to take off honey comb dressing after 5 days from surgery=Tuesday 12- 8-20 She is aware of appointments and office number [phone] to call if she has any questions or concerns.

## 2019-06-19 ENCOUNTER — Other Ambulatory Visit: Payer: Self-pay

## 2019-06-23 ENCOUNTER — Telehealth: Payer: Self-pay | Admitting: Gynecologic Oncology

## 2019-06-26 ENCOUNTER — Encounter: Payer: Self-pay | Admitting: Gynecologic Oncology

## 2019-06-26 ENCOUNTER — Inpatient Hospital Stay: Payer: Medicare Other | Attending: Gynecologic Oncology | Admitting: Gynecologic Oncology

## 2019-06-26 DIAGNOSIS — C541 Malignant neoplasm of endometrium: Secondary | ICD-10-CM

## 2019-06-26 DIAGNOSIS — Z7189 Other specified counseling: Secondary | ICD-10-CM

## 2019-06-26 NOTE — Progress Notes (Signed)
Gynecologic Oncology Telehealth Follow-up Note: Gyn-Onc  I connected with Nicole Sampson on 06/26/19 at  3:30 PM EST by telephone and verified that I am speaking with the correct person using two identifiers.  I discussed the limitations, risks, security and privacy concerns of performing an evaluation and management service by telemedicine and the availability of in-person appointments. I also discussed with the patient that there may be a patient responsible charge related to this service. The patient expressed understanding and agreed to proceed.  Other persons participating in the visit and their role in the encounter: none.  Patient's location: home Provider's location: [facility]  Chief Complaint:  Chief Complaint  Patient presents with  . endometrial cancer    Assessment/Plan:  Nicole. Nicole Sampson  is a 68 y.o.  A history of stage IA grade 1 endometrial adenocarcinoma s/p robotic staging on 06/15/19.  Pathology revealed low risk factors for recurrence, therefore no adjuvant therapy is recommended according to NCCN guidelines.  I discussed risk for recurrence and typical symptoms encouraged her to notify us of these should they develop between visits.  I recommend she have follow-up every 6 months for 5 years in accordance with NCCN guidelines. Those visits should include symptom assessment, physical exam and pelvic examination. Pap smears are not indicated or recommended in the routine surveillance of endometrial cancer.  HPI: Nicole Sampson is a 68 year old P0 who is seen in consultation at the request of Dr Vanessa Kick for evaluation of endometrial cancer.  The patient reported having postmenopausal bleeding beginning in August 2020.  She had a scheduled gynecology visit with Dr. Harrington Challenger for October 2020 and decided she would bring it up at this time.  At her initial visit she had a Pap test that was normal.  She return for separate visit for further work-up of her  bleeding.  At that visit a transvaginal ultrasound scan was performed which confirmed a thickened endometrial stripe this was performed on March 15, 2019.  An endometrial Pipelle biopsy was performed in the office which was benign.  She was then taken for hysteroscopy D&C procedure.  This was performed on May 11, 2019 and revealed grade 1 endometrial cancer.  Patient is morbidly obese with a BMI of 41 kg meters squared.  She has hypertension and diabetes mellitus.  She reports her last hemoglobin A1c earlier this year was 6.3%.  She takes aspirin for heart health.  She does not take insulin.  She has never been pregnant.  She lives alone.  Her family history is significant for mother who had uterine cancer in her 62s who was overweight.  She has never had abdominal surgery.  Interval Hx:  On 06/15/19 she underwent a robotic assisted total hysterectomy with BSO and SLN biopsy. Intraoperative findings were unremarkable. Surgery was uncomplicated.  Final pathology revealed a FIGO stage IA grade 1 endometrial cancer with 11mm of 15 mm myo invasion, no LVSI, negative nodes, adnexa and cervix.  Since surgery she has done well.  Current Meds:  Outpatient Encounter Medications as of 06/26/2019  Medication Sig  . aspirin EC 81 MG tablet Take 81 mg by mouth daily.  . Calcium Citrate-Vitamin D (CALCIUM + D PO) Take 1 tablet by mouth daily.   . hydrochlorothiazide (MICROZIDE) 12.5 MG capsule Take 12.5 mg by mouth daily.  Marland Kitchen ibuprofen (ADVIL) 800 MG tablet Take 1 tablet (800 mg total) by mouth every 8 (eight) hours as needed.  Marland Kitchen losartan (COZAAR) 50 MG  tablet Take 50 mg by mouth daily.   . metFORMIN (GLUCOPHAGE) 500 MG tablet Take 500 mg by mouth daily with breakfast.   . Multiple Vitamin (MULTIVITAMIN) tablet Take 1 tablet by mouth daily.  Marland Kitchen senna-docusate (SENOKOT-S) 8.6-50 MG tablet Take 2 tablets by mouth at bedtime. For AFTER surgery, do not take if having diarrhea  . traMADol (ULTRAM) 50 MG  tablet Take 1 tablet (50 mg total) by mouth every 6 (six) hours as needed for severe pain. For AFTER surgery, do not take and drive   No facility-administered encounter medications on file as of 06/26/2019.    Allergy:  Allergies  Allergen Reactions  . Other Other (See Comments)    "lime jello causes swelling"    Social Hx:   Social History   Socioeconomic History  . Marital status: Single    Spouse name: Not on file  . Number of children: Not on file  . Years of education: Not on file  . Highest education level: Not on file  Occupational History  . Not on file  Tobacco Use  . Smoking status: Never Smoker  . Smokeless tobacco: Never Used  Substance and Sexual Activity  . Alcohol use: Never  . Drug use: Never  . Sexual activity: Not on file  Other Topics Concern  . Not on file  Social History Narrative  . Not on file   Social Determinants of Health   Financial Resource Strain:   . Difficulty of Paying Living Expenses: Not on file  Food Insecurity:   . Worried About Charity fundraiser in the Last Year: Not on file  . Ran Out of Food in the Last Year: Not on file  Transportation Needs:   . Lack of Transportation (Medical): Not on file  . Lack of Transportation (Non-Medical): Not on file  Physical Activity:   . Days of Exercise per Week: Not on file  . Minutes of Exercise per Session: Not on file  Stress:   . Feeling of Stress : Not on file  Social Connections:   . Frequency of Communication with Friends and Family: Not on file  . Frequency of Social Gatherings with Friends and Family: Not on file  . Attends Religious Services: Not on file  . Active Member of Clubs or Organizations: Not on file  . Attends Archivist Meetings: Not on file  . Marital Status: Not on file  Intimate Partner Violence:   . Fear of Current or Ex-Partner: Not on file  . Emotionally Abused: Not on file  . Physically Abused: Not on file  . Sexually Abused: Not on file     Past Surgical Hx:  Past Surgical History:  Procedure Laterality Date  . BREAST MASS EXCISION Left 04/1996  . HYSTEROSCOPY W/D&C N/A 05/11/2019   Procedure: DILATATION AND CURETTAGE /HYSTEROSCOPY;  Surgeon: Vanessa Kick, MD;  Location: [facility];  Service: Gynecology;  Laterality: N/A;  . ROBOTIC ASSISTED TOTAL HYSTERECTOMY WITH BILATERAL SALPINGO OOPHERECTOMY Bilateral 06/15/2019   Procedure: XI ROBOTIC ASSISTED TOTAL HYSTERECTOMY GREATER THAN 250 GRAMS, WITH BILATERAL SALPINGO OOPHORECTOMY;  Surgeon: Everitt Amber, MD;  Location: [facility];  Service: Gynecology;  Laterality: Bilateral;  . SENTINEL NODE BIOPSY N/A 06/15/2019   Procedure: SENTINEL NODE BIOPSY;  Surgeon: Everitt Amber, MD;  Location: [facility];  Service: Gynecology;  Laterality: N/A;    Past Medical Hx:  Past Medical History:  Diagnosis Date  . Arthritis    knees  . Full dentures   .  GERD (gastroesophageal reflux disease)   . Hypertension    followed by pcp  (05-10-2019  per pt never had a stress test)  . PMB (postmenopausal bleeding)   . Type 2 diabetes mellitus ([place])    followed by pcp  (05-10-2019 check's cbg twice weekly,  fasting cbg-- 103)  . Wears glasses     Past Gynecological History:  G0 No LMP recorded. Patient is postmenopausal.  Family Hx:  Family History  Problem Relation Age of Onset  . Uterine cancer Mother   . Diabetes Mother   . Hypertension Mother   . Diabetes Father   . Hypertension Brother     Review of Systems:  Constitutional  Feels well,    ENT Normal appearing ears and nares bilaterally Skin/Breast  No rash, sores, jaundice, itching, dryness Cardiovascular  No chest pain, shortness of breath, or edema  Pulmonary  No cough or wheeze.  Gastro Intestinal  No nausea, vomitting, or diarrhoea. No bright red blood per rectum, no abdominal pain, change in bowel movement, or constipation.  Genito Urinary  No frequency, urgency, dysuria, + postmenopausal bleeding Musculo  Skeletal  No myalgia, arthralgia, joint swelling or pain  Neurologic  No weakness, numbness, change in gait,  Psychology  No depression, anxiety, insomnia.   Vitals:  There were no vitals taken for this visit.  Physical Exam: Deferred (telehealth visit).  I discussed the assessment and treatment plan with the patient. The patient was provided with an opportunity to ask questions and all were answered. The patient agreed with the plan and demonstrated an understanding of the instructions.   The patient was advised to call back or see an in-person evaluation if the symptoms worsen or if the condition fails to improve as anticipated.   I provided 10 minutes of non face-to-face telephone visit time during this encounter, and > 50% was spent counseling as documented under my assessment & plan.    Thereasa Solo, MD  06/26/2019, 2:26 PM

## 2019-06-28 LAB — SURGICAL PATHOLOGY

## 2019-07-04 ENCOUNTER — Inpatient Hospital Stay (HOSPITAL_BASED_OUTPATIENT_CLINIC_OR_DEPARTMENT_OTHER): Payer: Medicare Other | Admitting: Gynecologic Oncology

## 2019-07-04 ENCOUNTER — Other Ambulatory Visit: Payer: Self-pay

## 2019-07-04 ENCOUNTER — Encounter: Payer: Self-pay | Admitting: Gynecologic Oncology

## 2019-07-04 ENCOUNTER — Encounter (HOSPITAL_COMMUNITY): Payer: Self-pay | Admitting: Gynecologic Oncology

## 2019-07-04 VITALS — BP 147/60 | HR 86 | Temp 98.3°F | Resp 17 | Ht 68.0 in | Wt 269.4 lb

## 2019-07-04 DIAGNOSIS — I10 Essential (primary) hypertension: Secondary | ICD-10-CM

## 2019-07-04 DIAGNOSIS — Z8049 Family history of malignant neoplasm of other genital organs: Secondary | ICD-10-CM

## 2019-07-04 DIAGNOSIS — Z90722 Acquired absence of ovaries, bilateral: Secondary | ICD-10-CM

## 2019-07-04 DIAGNOSIS — E119 Type 2 diabetes mellitus without complications: Secondary | ICD-10-CM

## 2019-07-04 DIAGNOSIS — Z7982 Long term (current) use of aspirin: Secondary | ICD-10-CM

## 2019-07-04 DIAGNOSIS — Z7189 Other specified counseling: Secondary | ICD-10-CM

## 2019-07-04 DIAGNOSIS — R897 Abnormal histological findings in specimens from other organs, systems and tissues: Secondary | ICD-10-CM

## 2019-07-04 DIAGNOSIS — Z90710 Acquired absence of both cervix and uterus: Secondary | ICD-10-CM

## 2019-07-04 DIAGNOSIS — C541 Malignant neoplasm of endometrium: Secondary | ICD-10-CM

## 2019-07-04 DIAGNOSIS — Z6841 Body Mass Index (BMI) 40.0 and over, adult: Secondary | ICD-10-CM

## 2019-07-04 NOTE — Patient Instructions (Signed)
Please notify Dr Denman George at phone number [phone] if you notice vaginal bleeding, new pelvic or abdominal pains, bloating, feeling full easy, or a change in bladder or bowel function.   Dr Denman George will see you again in June for a cancer check up. Please return to see Dr Harrington Challenger annually in the fall/winter.  Dr Denman George is referring you to the genetics specialist because your tumor showed something called "microsatellite instability" which can sometimes be from a genetic predisposition to different cancers. The genetics specialists will determine if this is the case.

## 2019-07-04 NOTE — Progress Notes (Signed)
Gynecologic Oncology Follow-up Note  Chief Complaint:  Chief Complaint  Patient presents with  . Endometrial adenocarcinoma [facility])    Assessment/Plan:  [name]  is a 68 y.o.  A history of stage IA grade 1 endometrial adenocarcinoma s/p robotic staging on 06/15/19. MSI High with loss of MLH1.  Pathology revealed low risk factors for recurrence, therefore no adjuvant therapy is recommended according to NCCN guidelines.  She is recommended to undergo genetics consultation given the loss of MLH1 in her tumor.  I discussed risk for recurrence and typical symptoms encouraged her to notify us of these should they develop between visits.  I recommend she have follow-up every 6 months for 5 years in accordance with NCCN guidelines. Those visits should include symptom assessment, physical exam and pelvic examination. Pap smears are not indicated or recommended in the routine surveillance of endometrial cancer.  HPI: [name] is a 68 year old P0 who is seen in consultation at the request of Dr Vanessa Kick for evaluation of endometrial cancer.  The patient reported having postmenopausal bleeding beginning in August 2020.  She had a scheduled gynecology visit with Dr. Harrington Challenger for October 2020 and decided she would bring it up at this time.  At her initial visit she had a Pap test that was normal.  She return for separate visit for further work-up of her bleeding.  At that visit a transvaginal ultrasound scan was performed which confirmed a thickened endometrial stripe this was performed on March 15, 2019.  An endometrial Pipelle biopsy was performed in the office which was benign.  She was then taken for hysteroscopy D&C procedure.  This was performed on May 11, 2019 and revealed grade 1 endometrial cancer.  Patient is morbidly obese with a BMI of 41 kg meters squared.  She has hypertension and diabetes mellitus.  She reports her last hemoglobin A1c earlier this year was 6.3%.  She  takes aspirin for heart health.  She does not take insulin.  She has never been pregnant.  She lives alone.  Her family history is significant for mother who had uterine cancer in her 16s who was overweight.  She has never had abdominal surgery.  Interval Hx:  On 06/15/19 she underwent a robotic assisted total hysterectomy with BSO and SLN biopsy. Intraoperative findings were unremarkable. Surgery was uncomplicated.  Final pathology revealed a FIGO stage IA grade 1 endometrial cancer with 65m of 15 mm myo invasion, no LVSI, negative nodes, adnexa and cervix. Loss of MLH1 was noted on IHC (MSI high).  Since surgery she has done well.  Current Meds:  Outpatient Encounter Medications as of 07/04/2019  Medication Sig  . aspirin EC 81 MG tablet Take 81 mg by mouth daily.  . Calcium Citrate-Vitamin D (CALCIUM + D PO) Take 1 tablet by mouth daily.   . hydrochlorothiazide (MICROZIDE) 12.5 MG capsule Take 12.5 mg by mouth daily.  .Marland Kitchenibuprofen (ADVIL) 800 MG tablet Take 1 tablet (800 mg total) by mouth every 8 (eight) hours as needed.  .Marland Kitchenlosartan (COZAAR) 50 MG tablet Take 50 mg by mouth daily.   . metFORMIN (GLUCOPHAGE) 500 MG tablet Take 500 mg by mouth daily with breakfast.   . Multiple Vitamin (MULTIVITAMIN) tablet Take 1 tablet by mouth daily.  .Marland Kitchensenna-docusate (SENOKOT-S) 8.6-50 MG tablet Take 2 tablets by mouth at bedtime. For AFTER surgery, do not take if having diarrhea  . traMADol (ULTRAM) 50 MG tablet Take 1 tablet (50 mg total) by  mouth every 6 (six) hours as needed for severe pain. For AFTER surgery, do not take and drive (Patient not taking: Reported on 07/04/2019)   No facility-administered encounter medications on file as of 07/04/2019.    Allergy:  Allergies  Allergen Reactions  . Other Other (See Comments)    "lime jello causes swelling"    Social Hx:   Social History   Socioeconomic History  . Marital status: Single    Spouse name: Not on file  . Number of children:  Not on file  . Years of education: Not on file  . Highest education level: Not on file  Occupational History  . Not on file  Tobacco Use  . Smoking status: Never Smoker  . Smokeless tobacco: Never Used  Substance and Sexual Activity  . Alcohol use: Never  . Drug use: Never  . Sexual activity: Not on file  Other Topics Concern  . Not on file  Social History Narrative  . Not on file   Social Determinants of Health   Financial Resource Strain:   . Difficulty of Paying Living Expenses: Not on file  Food Insecurity:   . Worried About Charity fundraiser in the Last Year: Not on file  . Ran Out of Food in the Last Year: Not on file  Transportation Needs:   . Lack of Transportation (Medical): Not on file  . Lack of Transportation (Non-Medical): Not on file  Physical Activity:   . Days of Exercise per Week: Not on file  . Minutes of Exercise per Session: Not on file  Stress:   . Feeling of Stress : Not on file  Social Connections:   . Frequency of Communication with Friends and Family: Not on file  . Frequency of Social Gatherings with Friends and Family: Not on file  . Attends Religious Services: Not on file  . Active Member of Clubs or Organizations: Not on file  . Attends Archivist Meetings: Not on file  . Marital Status: Not on file  Intimate Partner Violence:   . Fear of Current or Ex-Partner: Not on file  . Emotionally Abused: Not on file  . Physically Abused: Not on file  . Sexually Abused: Not on file    Past Surgical Hx:  Past Surgical History:  Procedure Laterality Date  . BREAST MASS EXCISION Left 04/1996  . HYSTEROSCOPY WITH D & C N/A 05/11/2019   Procedure: DILATATION AND CURETTAGE /HYSTEROSCOPY;  Surgeon: Vanessa Kick, MD;  Location: [place];  Service: Gynecology;  Laterality: N/A;  . ROBOTIC ASSISTED TOTAL HYSTERECTOMY WITH BILATERAL SALPINGO OOPHERECTOMY Bilateral 06/15/2019   Procedure: XI ROBOTIC ASSISTED TOTAL HYSTERECTOMY  GREATER THAN 250 GRAMS, WITH BILATERAL SALPINGO OOPHORECTOMY;  Surgeon: Everitt Amber, MD;  Location: [facility];  Service: Gynecology;  Laterality: Bilateral;  . SENTINEL NODE BIOPSY N/A 06/15/2019   Procedure: SENTINEL NODE BIOPSY;  Surgeon: Everitt Amber, MD;  Location: [facility];  Service: Gynecology;  Laterality: N/A;    Past Medical Hx:  Past Medical History:  Diagnosis Date  . Arthritis    knees  . Full dentures   . GERD (gastroesophageal reflux disease)   . Hypertension    followed by pcp  (05-10-2019  per pt never had a stress test)  . PMB (postmenopausal bleeding)   . Type 2 diabetes mellitus ([place])    followed by pcp  (05-10-2019 check's cbg twice weekly,  fasting cbg-- 103)  . Wears glasses     Past Gynecological  History:  G0 No LMP recorded. Patient is postmenopausal.  Family Hx:  Family History  Problem Relation Age of Onset  . Uterine cancer Mother   . Diabetes Mother   . Hypertension Mother   . Diabetes Father   . Hypertension Brother     Review of Systems:  Constitutional  Feels well,    ENT Normal appearing ears and nares bilaterally Skin/Breast  No rash, sores, jaundice, itching, dryness Cardiovascular  No chest pain, shortness of breath, or edema  Pulmonary  No cough or wheeze.  Gastro Intestinal  No nausea, vomitting, or diarrhoea. No bright red blood per rectum, no abdominal pain, change in bowel movement, or constipation.  Genito Urinary  No frequency, urgency, dysuria, + postmenopausal bleeding Musculo Skeletal  No myalgia, arthralgia, joint swelling or pain  Neurologic  No weakness, numbness, change in gait,  Psychology  No depression, anxiety, insomnia.   Vitals:  Blood pressure (!) 147/60, pulse 86, temperature 98.3 F (36.8 C), temperature source Temporal, resp. rate 17, height _0  (1.727 m), weight 269 lb 6.4 oz (122.2 kg), SpO2 100 %.  Physical Exam: WD in NAD Neck  Supple NROM, without any enlargements.  Lymph Node Survey No cervical  supraclavicular or inguinal adenopathy Cardiovascular  Pulse normal rate, regularity and rhythm. S1 and S2 normal.  Lungs  Clear to auscultation bilateraly, without wheezes/crackles/rhonchi. Good air movement.  Skin  No rash/lesions/breakdown  Psychiatry  Alert and oriented to person, place, and time  Abdomen  Normoactive bowel sounds, abdomen soft, non-tender and obese without evidence of hernia. Well healed incisions Back No CVA tenderness Genito Urinary  Vulva/vagina: Normal external female genitalia.  No lesions. No discharge or bleeding.  Bladder/urethra:  No lesions or masses, well supported bladder  Vagina: vaginal cuff in tact, no bleeding, no lesions, no palpable masses  Cervix and uterus surgically absent  Adnexa: no palpable masses. Rectal  deferred Extremities  No bilateral cyanosis, clubbing or edema.   30 minutes of direct face to face counseling time was spent with the patient. This included discussion about prognosis, therapy recommendations and postoperative side effects and are beyond the scope of routine postoperative care.  Thereasa Solo, MD  07/04/2019, 10:46 AM

## 2019-07-05 ENCOUNTER — Inpatient Hospital Stay (HOSPITAL_COMMUNITY)
Admission: EM | Admit: 2019-07-05 | Discharge: 2019-07-08 | DRG: 493 | Disposition: A | Discharge status: Home Health | Payer: Medicare Other | Attending: Family Medicine | Admitting: Family Medicine

## 2019-07-05 ENCOUNTER — Inpatient Hospital Stay (HOSPITAL_COMMUNITY): Payer: Medicare Other

## 2019-07-05 ENCOUNTER — Other Ambulatory Visit: Payer: Self-pay

## 2019-07-05 ENCOUNTER — Emergency Department (HOSPITAL_COMMUNITY): Payer: Medicare Other

## 2019-07-05 ENCOUNTER — Encounter (HOSPITAL_COMMUNITY): Payer: Self-pay | Admitting: Emergency Medicine

## 2019-07-05 DIAGNOSIS — W010XXA Fall on same level from slipping, tripping and stumbling without subsequent striking against object, initial encounter: Secondary | ICD-10-CM | POA: Diagnosis present

## 2019-07-05 DIAGNOSIS — Z90722 Acquired absence of ovaries, bilateral: Secondary | ICD-10-CM | POA: Diagnosis not present

## 2019-07-05 DIAGNOSIS — M170 Bilateral primary osteoarthritis of knee: Secondary | ICD-10-CM | POA: Diagnosis present

## 2019-07-05 DIAGNOSIS — W19XXXA Unspecified fall, initial encounter: Secondary | ICD-10-CM

## 2019-07-05 DIAGNOSIS — Z7984 Long term (current) use of oral hypoglycemic drugs: Secondary | ICD-10-CM | POA: Diagnosis not present

## 2019-07-05 DIAGNOSIS — S82851A Displaced trimalleolar fracture of right lower leg, initial encounter for closed fracture: Secondary | ICD-10-CM | POA: Diagnosis not present

## 2019-07-05 DIAGNOSIS — Z8049 Family history of malignant neoplasm of other genital organs: Secondary | ICD-10-CM

## 2019-07-05 DIAGNOSIS — Y9301 Activity, walking, marching and hiking: Secondary | ICD-10-CM | POA: Diagnosis present

## 2019-07-05 DIAGNOSIS — S82891A Other fracture of right lower leg, initial encounter for closed fracture: Secondary | ICD-10-CM | POA: Diagnosis present

## 2019-07-05 DIAGNOSIS — I10 Essential (primary) hypertension: Secondary | ICD-10-CM | POA: Diagnosis present

## 2019-07-05 DIAGNOSIS — E119 Type 2 diabetes mellitus without complications: Secondary | ICD-10-CM | POA: Diagnosis present

## 2019-07-05 DIAGNOSIS — Z03818 Encounter for observation for suspected exposure to other biological agents ruled out: Secondary | ICD-10-CM | POA: Diagnosis not present

## 2019-07-05 DIAGNOSIS — M25571 Pain in right ankle and joints of right foot: Secondary | ICD-10-CM | POA: Diagnosis present

## 2019-07-05 DIAGNOSIS — Z01818 Encounter for other preprocedural examination: Secondary | ICD-10-CM | POA: Diagnosis not present

## 2019-07-05 DIAGNOSIS — S82891D Other fracture of right lower leg, subsequent encounter for closed fracture with routine healing: Secondary | ICD-10-CM | POA: Diagnosis not present

## 2019-07-05 DIAGNOSIS — K219 Gastro-esophageal reflux disease without esophagitis: Secondary | ICD-10-CM | POA: Diagnosis present

## 2019-07-05 DIAGNOSIS — Z9079 Acquired absence of other genital organ(s): Secondary | ICD-10-CM | POA: Diagnosis not present

## 2019-07-05 DIAGNOSIS — S3993XA Unspecified injury of pelvis, initial encounter: Secondary | ICD-10-CM | POA: Diagnosis not present

## 2019-07-05 DIAGNOSIS — Y92000 Kitchen of unspecified non-institutional (private) residence as  the place of occurrence of the external cause: Secondary | ICD-10-CM | POA: Diagnosis not present

## 2019-07-05 DIAGNOSIS — K08409 Partial loss of teeth, unspecified cause, unspecified class: Secondary | ICD-10-CM | POA: Diagnosis present

## 2019-07-05 DIAGNOSIS — Z20828 Contact with and (suspected) exposure to other viral communicable diseases: Secondary | ICD-10-CM | POA: Diagnosis present

## 2019-07-05 DIAGNOSIS — N289 Disorder of kidney and ureter, unspecified: Secondary | ICD-10-CM | POA: Diagnosis present

## 2019-07-05 DIAGNOSIS — C541 Malignant neoplasm of endometrium: Secondary | ICD-10-CM | POA: Diagnosis present

## 2019-07-05 DIAGNOSIS — Z8781 Personal history of (healed) traumatic fracture: Secondary | ICD-10-CM

## 2019-07-05 DIAGNOSIS — Z79899 Other long term (current) drug therapy: Secondary | ICD-10-CM | POA: Diagnosis not present

## 2019-07-05 DIAGNOSIS — Z90710 Acquired absence of both cervix and uterus: Secondary | ICD-10-CM | POA: Diagnosis not present

## 2019-07-05 DIAGNOSIS — I517 Cardiomegaly: Secondary | ICD-10-CM | POA: Diagnosis not present

## 2019-07-05 DIAGNOSIS — Z833 Family history of diabetes mellitus: Secondary | ICD-10-CM

## 2019-07-05 DIAGNOSIS — Z7982 Long term (current) use of aspirin: Secondary | ICD-10-CM | POA: Diagnosis not present

## 2019-07-05 DIAGNOSIS — Z8249 Family history of ischemic heart disease and other diseases of the circulatory system: Secondary | ICD-10-CM | POA: Diagnosis not present

## 2019-07-05 DIAGNOSIS — Z6841 Body Mass Index (BMI) 40.0 and over, adult: Secondary | ICD-10-CM

## 2019-07-05 DIAGNOSIS — M25551 Pain in right hip: Secondary | ICD-10-CM | POA: Diagnosis not present

## 2019-07-05 DIAGNOSIS — Z9889 Other specified postprocedural states: Secondary | ICD-10-CM

## 2019-07-05 LAB — TYPE AND SCREEN
ABO/RH(D): A NEG
Antibody Screen: NEGATIVE

## 2019-07-05 LAB — BASIC METABOLIC PANEL
Anion gap: 11 (ref 5–15)
BUN: 34 mg/dL — ABNORMAL HIGH (ref 8–23)
CO2: 23 mmol/L (ref 22–32)
Calcium: 8.9 mg/dL (ref 8.9–10.3)
Chloride: 102 mmol/L (ref 98–111)
Creatinine, Ser: 1.39 mg/dL — ABNORMAL HIGH (ref 0.44–1.00)
GFR calc Af Amer: 46 mL/min — ABNORMAL LOW (ref >60)
GFR calc non Af Amer: 39 mL/min — ABNORMAL LOW (ref >60)
Glucose, Bld: 149 mg/dL — ABNORMAL HIGH (ref 70–99)
Potassium: 4.1 mmol/L (ref 3.5–5.1)
Sodium: 136 mmol/L (ref 135–145)

## 2019-07-05 LAB — POC SARS CORONAVIRUS 2 AG -  ED: SARS Coronavirus 2 Ag: NEGATIVE

## 2019-07-05 LAB — RESPIRATORY PANEL BY RT PCR (FLU A&B, COVID)
Influenza A by PCR: NEGATIVE
Influenza B by PCR: NEGATIVE
SARS Coronavirus 2 by RT PCR: NEGATIVE

## 2019-07-05 LAB — CBC
HCT: 35.6 % — ABNORMAL LOW (ref 36.0–46.0)
Hemoglobin: 11.2 g/dL — ABNORMAL LOW (ref 12.0–15.0)
MCH: 28.2 pg (ref 26.0–34.0)
MCHC: 31.5 g/dL (ref 30.0–36.0)
MCV: 89.7 fL (ref 80.0–100.0)
Platelets: 229 10*3/uL (ref 150–400)
RBC: 3.97 MIL/uL (ref 3.87–5.11)
RDW: 14.4 % (ref 11.5–15.5)
WBC: 10.1 10*3/uL (ref 4.0–10.5)
nRBC: 0 % (ref 0.0–0.2)

## 2019-07-05 MED ORDER — ACETAMINOPHEN 325 MG PO TABS
650.0000 mg | ORAL_TABLET | Freq: Four times a day (QID) | ORAL | Status: DC | PRN
  Administered 2019-07-05: 650 mg via ORAL
  Filled 2019-07-05: qty 2

## 2019-07-05 MED ORDER — POLYETHYLENE GLYCOL 3350 17 G PO PACK: 17.0000 g | PACK | Freq: Every day | ORAL | Status: DC | PRN

## 2019-07-05 MED ORDER — ACETAMINOPHEN 650 MG RE SUPP: 650.0000 mg | Freq: Four times a day (QID) | RECTAL | Status: DC | PRN

## 2019-07-05 MED ORDER — ONDANSETRON HCL 4 MG/2ML IJ SOLN: 4.0000 mg | Freq: Four times a day (QID) | INTRAMUSCULAR | Status: DC | PRN

## 2019-07-05 MED ORDER — CYCLOBENZAPRINE HCL 10 MG PO TABS
5.0000 mg | ORAL_TABLET | Freq: Three times a day (TID) | ORAL | Status: DC | PRN
  Administered 2019-07-05 – 2019-07-06 (×2): 5 mg via ORAL
  Filled 2019-07-05 (×2): qty 1

## 2019-07-05 MED ORDER — ONDANSETRON HCL 4 MG PO TABS: 4.0000 mg | ORAL_TABLET | Freq: Four times a day (QID) | ORAL | Status: DC | PRN

## 2019-07-05 MED ORDER — ONDANSETRON HCL 4 MG/2ML IJ SOLN
4.0000 mg | Freq: Once | INTRAMUSCULAR | Status: AC
  Administered 2019-07-05: 4 mg via INTRAVENOUS
  Filled 2019-07-05: qty 2

## 2019-07-05 MED ORDER — MORPHINE SULFATE (PF) 2 MG/ML IV SOLN
2.0000 mg | INTRAVENOUS | Status: DC | PRN
  Administered 2019-07-05 – 2019-07-06 (×3): 2 mg via INTRAVENOUS
  Filled 2019-07-05 (×3): qty 1

## 2019-07-05 MED ORDER — MORPHINE SULFATE (PF) 4 MG/ML IV SOLN
4.0000 mg | Freq: Once | INTRAVENOUS | Status: AC
  Administered 2019-07-05: 4 mg via INTRAVENOUS
  Filled 2019-07-05: qty 1

## 2019-07-05 MED ORDER — CYCLOBENZAPRINE HCL 10 MG PO TABS
10.0000 mg | ORAL_TABLET | Freq: Once | ORAL | Status: AC
  Administered 2019-07-05: 10 mg via ORAL
  Filled 2019-07-05: qty 1

## 2019-07-05 NOTE — ED Notes (Signed)
Meal tray given 

## 2019-07-05 NOTE — ED Triage Notes (Signed)
Patient slipped and fell this am, C/O pain to R hip and ankle. External rotation of R leg.

## 2019-07-05 NOTE — ED Provider Notes (Signed)
[facility] EMERGENCY DEPARTMENT Provider Note   CSN: [id number] Arrival date & time: 07/05/19  1347     History Chief Complaint  Patient presents with  . Fall    Nicole Sampson is a 68 y.o. female.  HPI   Patient is a very pleasant 68 year old female, she takes an aspirin every other day, no other anticoagulants, last ate this morning for breakfast.  Reports that she was walking when she slipped and fell landing on her right ankle in an awkward way, she had an obvious deformity and call for paramedics when she was not able to get up.  This occurred just prior to arrival, symptoms are persistent, worse with standing, associated with bruising and swelling.  Denies any numbness.  Paramedics did not immobilize the patient, vital signs otherwise unremarkable. She denies any recent Covid symptoms or exposures.  Past Medical History:  Diagnosis Date  . Arthritis    knees  . Full dentures   . GERD (gastroesophageal reflux disease)   . Hypertension    followed by pcp  (05-10-2019  per pt never had a stress test)  . PMB (postmenopausal bleeding)   . Type 2 diabetes mellitus ([place])    followed by pcp  (05-10-2019 check's cbg twice weekly,  fasting cbg-- 103)  . Wears glasses     Patient Active Problem List   Diagnosis Date Noted  . 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 Diabetes mellitus ([place]) 06/15/2019  . Hypertensive disorder 06/15/2019  . Endometrial cancer ([place]) 06/15/2019  . Morbid obesity with BMI of 45.0-49.9, adult ([place]) 06/15/2019    Past Surgical History:  Procedure Laterality Date  . BREAST MASS EXCISION Left 04/1996  . HYSTEROSCOPY WITH D & C N/A 05/11/2019   Procedure: DILATATION AND CURETTAGE /HYSTEROSCOPY;  Surgeon: Vanessa Kick, MD;  Location: [place];  Service: Gynecology;  Laterality: N/A;  . ROBOTIC ASSISTED TOTAL HYSTERECTOMY WITH BILATERAL SALPINGO OOPHERECTOMY Bilateral 06/15/2019   Procedure: XI ROBOTIC ASSISTED TOTAL HYSTERECTOMY GREATER THAN 250 GRAMS, WITH BILATERAL SALPINGO OOPHORECTOMY;  Surgeon: Everitt Amber, MD;  Location: [facility];  Service: Gynecology;  Laterality: Bilateral;  . SENTINEL NODE BIOPSY N/A 06/15/2019   Procedure: SENTINEL NODE BIOPSY;  Surgeon: Everitt Amber, MD;  Location: [facility];  Service: Gynecology;  Laterality: N/A;     OB History    Gravida      Para      Term      Preterm      AB      Living  0     SAB      TAB      Ectopic      Multiple      Live Births              Family History  Problem Relation Age of Onset  . Uterine cancer Mother   . Diabetes Mother   . Hypertension Mother   . Diabetes Father   . Hypertension Brother     Social History   Tobacco Use  . Smoking status: Never Smoker  . Smokeless tobacco: Never Used  Substance Use Topics  . Alcohol use: Never  . Drug use: Never    Home Medications Prior to Admission medications   Medication Sig Start Date End Date Taking? Authorizing Provider  aspirin EC 81 MG tablet Take 81 mg by mouth daily.    [provider]  Calcium Citrate-Vitamin D (CALCIUM + D PO) Take 1  tablet by mouth daily.     [provider]  hydrochlorothiazide (MICROZIDE) 12.5 MG capsule Take 12.5 mg by mouth daily.    [provider]  ibuprofen (ADVIL) 800 MG tablet Take 1 tablet (800 mg total) by mouth every 8 (eight) hours as needed. 05/11/19   Vanessa Kick, MD  losartan (COZAAR) 50 MG tablet Take 50 mg by mouth daily.     [provider]  metFORMIN (GLUCOPHAGE) 500 MG tablet Take 500 mg by mouth daily with breakfast.     [provider]  Multiple Vitamin (MULTIVITAMIN) tablet Take 1 tablet by mouth daily.    [provider]  senna-docusate (SENOKOT-S) 8.6-50 MG tablet Take 2 tablets by mouth at bedtime. For AFTER surgery, do not take if having diarrhea 05/31/19   Cross, Lenna Sciara D, NP  traMADol (ULTRAM) 50 MG tablet Take 1 tablet  (50 mg total) by mouth every 6 (six) hours as needed for severe pain. For AFTER surgery, do not take and drive Patient not taking: Reported on 07/04/2019 05/31/19   Dorothyann Gibbs, NP    Allergies    Other  Review of Systems   Review of Systems  Musculoskeletal: Positive for joint swelling.  Skin: Negative for wound.  Neurological: Negative for weakness and numbness.    Physical Exam Updated Vital Signs BP 100/68 (BP Location: Right Arm)   Pulse (!) 101   Temp 98.3 F (36.8 C) (Oral)   Resp 18   Ht 1.753 m (5' 9")   Wt 122.5 kg   SpO2 97%   BMI 39.87 kg/m   Physical Exam Vitals and nursing note reviewed.  Constitutional:      Appearance: She is well-developed. She is not diaphoretic.  HENT:     Head: Normocephalic and atraumatic.  Eyes:     General:        Right eye: No discharge.        Left eye: No discharge.     Conjunctiva/sclera: Conjunctivae normal.  Cardiovascular:     Rate and Rhythm: Normal rate and regular rhythm.  Pulmonary:     Effort: Pulmonary effort is normal. No respiratory distress.  Musculoskeletal:        General: Swelling, tenderness, deformity and signs of injury present.     Comments: Right lower extremity with tenderness and crepitance at the ankle.  There is normal pulses at the dorsalis pedis, decreased range of motion secondary to the injury to the right ankle.  Normal knee exam hip exam and left and right upper extremities as well as the left lower extremities are all normal.  Skin:    General: Skin is warm and dry.     Findings: No erythema or rash.  Neurological:     Mental Status: She is alert.     Coordination: Coordination normal.     Comments: Normal sensation to the foot on the right     ED Results / Procedures / Treatments   Labs (all labs ordered are listed, but only abnormal results are displayed) Labs Reviewed  CBC  BASIC METABOLIC PANEL  POC SARS CORONAVIRUS 2 AG -  ED  TYPE AND SCREEN    EKG EKG  Interpretation  Date/Time:  Wednesday July 05 2019 15:48:08 EST Ventricular Rate:  89 PR Interval:    QRS Duration: 90 QT Interval:  359 QTC Calculation: 437 R Axis:   32 Text Interpretation: Sinus rhythm Low voltage, precordial leads Confirmed by Veryl Speak [phone]) on 07/05/2019 3:55:50 PM  Radiology DG Ankle Complete Right  Result Date: 07/05/2019 CLINICAL DATA:  Post reduction right ankle. EXAM: RIGHT ANKLE - COMPLETE 3+ VIEW COMPARISON:  Prior study same day. FINDINGS: Patient is casted. Displaced fractures of the distal right fibula, medial malleolus, posterior malleolus noted. Tibiotalar joint is disrupted with lateral subluxation of the talus again noted. IMPRESSION: Patient is casted. Displaced trimalleolar fractures with disruption of the tibiotalar joint and lateral subluxation of the talus again noted. Similar findings on prior exam. Electronically Signed   By: Marcello Moores  Register   On: 07/05/2019 15:05   DG Ankle Complete Right  Result Date: 07/05/2019 CLINICAL DATA:  Fall today EXAM: RIGHT ANKLE - COMPLETE 3+ VIEW COMPARISON:  None. FINDINGS: Trimalleolar ankle fracture. Lateral subluxation of the talus. Fracture distal fibula, medial malleolus, and posterior malleolus. IMPRESSION: Trimalleolar fracture dislocation of the ankle. Electronically Signed   By: Franchot Gallo M.D.   On: 07/05/2019 14:19   DG Chest Port 1 View  Result Date: 07/05/2019 CLINICAL DATA:  Preoperative exam. EXAM: PORTABLE CHEST 1 VIEW COMPARISON:  No prior. FINDINGS: Mediastinum and hilar structures are normal. Cardiomegaly. No pulmonary venous congestion. No focal infiltrate. No pleural effusion or pneumothorax. Degenerative change thoracic spine. IMPRESSION: Cardiomegaly.  No pulmonary venous congestion.  No acute infiltrate. Electronically Signed   By: Marcello Moores  Register   On: 07/05/2019 15:56    Procedures Reduction of fracture  Date/Time: 07/05/2019 2:45 PM Performed by: Noemi Chapel,  MD Authorized by: Noemi Chapel, MD  Consent: Verbal consent obtained. Risks and benefits: risks, benefits and alternatives were discussed Consent given by: patient Patient understanding: patient states understanding of the procedure being performed Required items: required blood products, implants, devices, and special equipment available Time out: Immediately prior to procedure a "time out" was called to verify the correct patient, procedure, equipment, support staff and site/side marked as required. Preparation: Patient was prepped and draped in the usual sterile fashion. Local anesthesia used: no  Anesthesia: Local anesthesia used: no  Sedation: Patient sedated: no  Patient tolerance: patient tolerated the procedure well with no immediate complications Comments: This trimalleolar fracture of the ankle was reduced using traction and pressure.  I personally immobilized the fracture after reduction and continue to hold pressure.  Follow-up imaging was ordered to confirm improvement, orthopedics consulted for definitive management   .Splint Application  Date/Time: 07/05/2019 2:46 PM Performed by: Noemi Chapel, MD Authorized by: Noemi Chapel, MD   Consent:    Consent obtained:  Verbal   Consent given by:  Patient   Risks discussed:  Discoloration, numbness, pain and swelling   Alternatives discussed:  No treatment Pre-procedure details:    Sensation:  Normal Procedure details:    Laterality:  Right   Location:  Ankle   Ankle:  R ankle   Splint type:  Short leg and ankle stirrup   Supplies:  Elastic bandage, cotton padding and Ortho-Glass Post-procedure details:    Pain:  Improved   Sensation:  Normal   Patient tolerance of procedure:  Tolerated well, no immediate complications Comments:         (including critical care time)  Medications Ordered in ED Medications  morphine 4 MG/ML injection 4 mg (has no administration in time range)  ondansetron (ZOFRAN)  injection 4 mg (has no administration in time range)    ED Course  I have reviewed the triage vital signs and the nursing notes.  Pertinent labs & imaging results that were available during my care of the patient were reviewed  by me and considered in my medical decision making (see chart for details).  Clinical Course as of Jul 04 1602  Wed Jul 05, 2019  1412 I have personally seen and interpreted the x-ray of the right ankle.  There does appear to be a fracture of the lateral fibula as well as the medial tibia, this is at least a bimalleolar fracture.  The patient will need to be immobilized.  Please see separate splinting note   [BM]    Clinical Course User Index [BM] Noemi Chapel, MD   MDM Rules/Calculators/A&P                      Clinically the patient does have what appears to be a fracture at least a bimalleolar fracture I would suspect given her exam.  She has normal pulses and sensation minimal swelling and bruising.  This is a closed wound.  X-ray immobilization, discussed with orthopedics as needed.  She otherwise has no injuries including her knee or any other parts of her body.  D/w Dr. Aline Brochure - requests admissino to medical service Pre op labs, CXR and EKG done  I discussed the care with Dr. Denton Brick of the hospitalist service who will admit  Final Clinical Impression(s) / ED Diagnoses Final diagnoses:  Closed trimalleolar fracture of right ankle, initial encounter    Rx / DC Orders ED Discharge Orders    None       Noemi Chapel, MD 07/05/19 [id number]

## 2019-07-05 NOTE — H&P (Addendum)
History and Physical    Nicole Sampson [id number] DOB: [birth year] DOA: 07/05/2019  PCP: Nicanor Bake C   Patient coming from: Home  I have personally briefly reviewed patient's old medical records in [place]  Chief Complaint: Fall.  HPI: Nicole Sampson is a 68 y.o. female with medical history significant for diabetes mellitus, hypertension, endometrial cancer.  Patient presented to the ED today with reports of a fall.  Patient reports that she was walking when her leg slipped and she fell on her right lower extremity.  She has had persistent pain since then.  She denies any difficulty breathing, no chest pain, no dizziness.  No vomiting no loose stools she has maintained good p.o. intake. Patient did not lose consciousness, she was aware the whole time, she did not hit her head.  ED Course: Stable vitals.  Unremarkable CBC, BMP.  Ankle x-ray shows trimalleolar fracture dislocation of the ankle.  Port Chest x-ray negative acute abnormality. EDP talked to Dr. Aline Brochure, admit here, will see patient in the morning.  Review of Systems: As per HPI all other systems reviewed and negative.  Past Medical History:  Diagnosis Date  . Arthritis    knees  . Full dentures   . GERD (gastroesophageal reflux disease)   . Hypertension    followed by pcp  (05-10-2019  per pt never had a stress test)  . PMB (postmenopausal bleeding)   . Type 2 diabetes mellitus ([place])    followed by pcp  (05-10-2019 check's cbg twice weekly,  fasting cbg-- 103)  . Wears glasses     Past Surgical History:  Procedure Laterality Date  . BREAST MASS EXCISION Left 04/1996  . HYSTEROSCOPY WITH D & C N/A 05/11/2019   Procedure: DILATATION AND CURETTAGE /HYSTEROSCOPY;  Surgeon: Vanessa Kick, MD;  Location: [place];  Service: Gynecology;  Laterality: N/A;  . ROBOTIC ASSISTED TOTAL HYSTERECTOMY WITH BILATERAL SALPINGO OOPHERECTOMY Bilateral 06/15/2019   Procedure: XI ROBOTIC ASSISTED  TOTAL HYSTERECTOMY GREATER THAN 250 GRAMS, WITH BILATERAL SALPINGO OOPHORECTOMY;  Surgeon: Everitt Amber, MD;  Location: [facility];  Service: Gynecology;  Laterality: Bilateral;  . SENTINEL NODE BIOPSY N/A 06/15/2019   Procedure: SENTINEL NODE BIOPSY;  Surgeon: Everitt Amber, MD;  Location: [facility];  Service: Gynecology;  Laterality: N/A;     reports that she has never smoked. She has never used smokeless tobacco. She reports that she does not drink alcohol or use drugs.  Allergies  Allergen Reactions  . Other Swelling and Other (See Comments)    "lime jello causes swelling"    Family History  Problem Relation Age of Onset  . Uterine cancer Mother   . Diabetes Mother   . Hypertension Mother   . Diabetes Father   . Hypertension Brother     Prior to Admission medications   Medication Sig Start Date End Date Taking? Authorizing Provider  ascorbic acid (VITAMIN C) 500 MG tablet Take 500 mg by mouth daily.   Yes [provider]  aspirin EC 81 MG tablet Take 81 mg by mouth every other day.    Yes [provider]  Calcium Citrate-Vitamin D (CALCIUM + D PO) Take 1 tablet by mouth daily.    Yes [provider]  hydrochlorothiazide (MICROZIDE) 12.5 MG capsule Take 12.5 mg by mouth daily.   Yes [provider]  losartan (COZAAR) 50 MG tablet Take 50 mg by mouth daily.    Yes [provider]  metFORMIN (GLUCOPHAGE) 500  MG tablet Take 500 mg by mouth daily with breakfast.    Yes [provider]  Multiple Vitamin (MULTIVITAMIN) tablet Take 1 tablet by mouth daily.   Yes [provider]  Potassium 99 MG TABS Take 1 tablet by mouth daily as needed (for cramps).   Yes [provider]  ibuprofen (ADVIL) 800 MG tablet Take 1 tablet (800 mg total) by mouth every 8 (eight) hours as needed. Patient not taking: Reported on 07/05/2019 05/11/19   Vanessa Kick, MD  senna-docusate (SENOKOT-S) 8.6-50 MG tablet Take 2 tablets by mouth at bedtime.  For AFTER surgery, do not take if having diarrhea Patient not taking: Reported on 07/05/2019 05/31/19   Joylene John D, NP  traMADol (ULTRAM) 50 MG tablet Take 1 tablet (50 mg total) by mouth every 6 (six) hours as needed for severe pain. For AFTER surgery, do not take and drive Patient not taking: Reported on 07/04/2019 05/31/19   Joylene John D, NP    Physical Exam: Vitals:   07/05/19 1800 07/05/19 1830 07/05/19 1900 07/05/19 2001  BP: 120/64 101/87 (!) 109/56 (!) 116/93  Pulse: 82 82 80 79  Resp: (!) 29 (!) 22 20 14   Temp:      TempSrc:      SpO2: 99% 95% 99% 100%  Weight:      Height:        Constitutional: NAD, calm, comfortable Vitals:   07/05/19 1800 07/05/19 1830 07/05/19 1900 07/05/19 2001  BP: 120/64 101/87 (!) 109/56 (!) 116/93  Pulse: 82 82 80 79  Resp: (!) 29 (!) 22 20 14   Temp:      TempSrc:      SpO2: 99% 95% 99% 100%  Weight:      Height:       Eyes: PERRL, lids and conjunctivae normal ENMT: Mucous membranes are moist. Posterior pharynx clear of any exudate or lesions.Normal dentition.  Neck: normal, supple, no masses, no thyromegaly Respiratory: clear to auscultation bilaterally, no wheezing, no crackles. Normal respiratory effort. No accessory muscle use.  Cardiovascular: Regular rate and rhythm, no murmurs / rubs / gallops. No extremity edema. 2+ pedal pulses. No carotid bruits.  Abdomen: no tenderness, no masses palpated. No hepatosplenomegaly. Bowel sounds positive.  Musculoskeletal: no clubbing / cyanosis. No joint deformity upper and lower extremities. Good ROM, no contractures. Normal muscle tone.  Skin: no rashes, lesions, ulcers. No induration Neurologic: CN 2-12 grossly intact. Sensation intact, DTR normal. Strength 5/5 in all extremities except right lower extremities not tested due to fracture.  Right lower extremity casted. Psychiatric: Normal judgment and insight. Alert and oriented x 3. Normal mood.   Labs on Admission: I have personally  reviewed following labs and imaging studies  CBC: Recent Labs  Lab 07/05/19 1540  WBC 10.1  HGB 11.2*  HCT 35.6*  MCV 89.7  PLT [id number]   Basic Metabolic Panel: Recent Labs  Lab 07/05/19 1540  NA 136  K 4.1  CL 102  CO2 23  GLUCOSE 149*  BUN 34*  CREATININE 1.39*  CALCIUM 8.9   Urine analysis:    Component Value Date/Time   COLORURINE YELLOW 06/14/2019 [place] 06/14/2019 1049   LABSPEC 1.009 06/14/2019 1049   PHURINE 5.0 06/14/2019 [place] 06/14/2019 1049   [place] (A) 06/14/2019 [place] NEGATIVE 06/14/2019 [place] 06/14/2019 1049   PROTEINUR NEGATIVE 06/14/2019 1049   NITRITE NEGATIVE 06/14/2019 1049   LEUKOCYTESUR LARGE (  A) 06/14/2019 1049    Radiological Exams on Admission: DG Ankle Complete Right  Result Date: 07/05/2019 CLINICAL DATA:  Post reduction right ankle. EXAM: RIGHT ANKLE - COMPLETE 3+ VIEW COMPARISON:  Prior study same day. FINDINGS: Patient is casted. Displaced fractures of the distal right fibula, medial malleolus, posterior malleolus noted. Tibiotalar joint is disrupted with lateral subluxation of the talus again noted. IMPRESSION: Patient is casted. Displaced trimalleolar fractures with disruption of the tibiotalar joint and lateral subluxation of the talus again noted. Similar findings on prior exam. Electronically Signed   By: Marcello Moores  Register   On: 07/05/2019 15:05   DG Ankle Complete Right  Result Date: 07/05/2019 CLINICAL DATA:  Fall today EXAM: RIGHT ANKLE - COMPLETE 3+ VIEW COMPARISON:  None. FINDINGS: Trimalleolar ankle fracture. Lateral subluxation of the talus. Fracture distal fibula, medial malleolus, and posterior malleolus. IMPRESSION: Trimalleolar fracture dislocation of the ankle. Electronically Signed   By: Franchot Gallo M.D.   On: 07/05/2019 14:19   DG Chest Port 1 View  Result Date: 07/05/2019 CLINICAL DATA:  Preoperative exam. EXAM: PORTABLE CHEST 1 VIEW  COMPARISON:  No prior. FINDINGS: Mediastinum and hilar structures are normal. Cardiomegaly. No pulmonary venous congestion. No focal infiltrate. No pleural effusion or pneumothorax. Degenerative change thoracic spine. IMPRESSION: Cardiomegaly.  No pulmonary venous congestion.  No acute infiltrate. Electronically Signed   By: Marcello Moores  Register   On: 07/05/2019 15:56    EKG: Independently reviewed.  Sinus rhythm, QTC 437.  No significant change compared to prior EKG.  Assessment/Plan Active Problems:   Closed right ankle fracture  Closed right ankle fracture-status post mechanical fall.  X-ray of right ankle shows trimalleolar fracture dislocation of the ankle. - EDP talked to Dr. Aline Brochure, will see in the morning -Check pelvic x-ray rule out hip fracture - PT eval when able -IV morphine 2 mg as needed -Complaints of persistent cramps despite morphine, will add Flexeril as needed -Hold home aspirin -N.p.o. midnight -EKG and portable chest x-ray without acute abnormalities.    Hypertension- borderline soft. - Will hold home HCTZ, losartan for now.  Diabetes mellitus-random glucose 149. - SSI  Endometrial cancer grade 1-status post recent robotic hysterectomy and salpingo-oophorectomy- 06/15/2019.    DVT prophylaxis: SCDs Code Status: Full code Family Communication: None at bedside Disposition Plan: Per rounding team Consults called: Orthopedics Admission status: Inpatient, MedSurg I certify that at the point of admission it is my clinical judgment that the patient will require inpatient hospital care spanning beyond 2 midnights from the point of admission due to high intensity of service, high risk for further deterioration and high frequency of surveillance required. The following factors support the patient status of inpatient: Acute fracture requiring surgery.   Bethena Roys MD [facility]  07/05/2019, 9:28 PM

## 2019-07-06 ENCOUNTER — Other Ambulatory Visit: Payer: Self-pay

## 2019-07-06 ENCOUNTER — Encounter (HOSPITAL_COMMUNITY)
Admission: EM | Disposition: A | Discharge status: Home Health | Payer: Self-pay | Source: Home / Self Care | Attending: Family Medicine

## 2019-07-06 ENCOUNTER — Encounter (HOSPITAL_COMMUNITY): Payer: Self-pay | Admitting: Internal Medicine

## 2019-07-06 ENCOUNTER — Inpatient Hospital Stay (HOSPITAL_COMMUNITY): Payer: Medicare Other

## 2019-07-06 ENCOUNTER — Inpatient Hospital Stay (HOSPITAL_COMMUNITY): Payer: Medicare Other | Admitting: Anesthesiology

## 2019-07-06 DIAGNOSIS — S82891A Other fracture of right lower leg, initial encounter for closed fracture: Secondary | ICD-10-CM

## 2019-07-06 DIAGNOSIS — W010XXA Fall on same level from slipping, tripping and stumbling without subsequent striking against object, initial encounter: Secondary | ICD-10-CM

## 2019-07-06 DIAGNOSIS — S82851A Displaced trimalleolar fracture of right lower leg, initial encounter for closed fracture: Principal | ICD-10-CM

## 2019-07-06 DIAGNOSIS — I10 Essential (primary) hypertension: Secondary | ICD-10-CM

## 2019-07-06 HISTORY — PX: ORIF ANKLE FRACTURE: SHX5408

## 2019-07-06 LAB — CBC
HCT: 33.9 % — ABNORMAL LOW (ref 36.0–46.0)
Hemoglobin: 10.6 g/dL — ABNORMAL LOW (ref 12.0–15.0)
MCH: 28.5 pg (ref 26.0–34.0)
MCHC: 31.3 g/dL (ref 30.0–36.0)
MCV: 91.1 fL (ref 80.0–100.0)
Platelets: 207 10*3/uL (ref 150–400)
RBC: 3.72 MIL/uL — ABNORMAL LOW (ref 3.87–5.11)
RDW: 14.6 % (ref 11.5–15.5)
WBC: 10.1 10*3/uL (ref 4.0–10.5)
nRBC: 0 % (ref 0.0–0.2)

## 2019-07-06 LAB — GLUCOSE, CAPILLARY
Glucose-Capillary: 152 mg/dL — ABNORMAL HIGH (ref 70–99)
Glucose-Capillary: 214 mg/dL — ABNORMAL HIGH (ref 70–99)

## 2019-07-06 LAB — SURGICAL PCR SCREEN
MRSA, PCR: NEGATIVE
Staphylococcus aureus: NEGATIVE

## 2019-07-06 LAB — CREATININE, SERUM
Creatinine, Ser: 1.55 mg/dL — ABNORMAL HIGH (ref 0.44–1.00)
GFR calc Af Amer: 40 mL/min — ABNORMAL LOW (ref >60)
GFR calc non Af Amer: 35 mL/min — ABNORMAL LOW (ref >60)

## 2019-07-06 LAB — HIV ANTIBODY (ROUTINE TESTING W REFLEX): HIV Screen 4th Generation wRfx: NONREACTIVE

## 2019-07-06 SURGERY — OPEN REDUCTION INTERNAL FIXATION (ORIF) ANKLE FRACTURE
Anesthesia: General | Site: Ankle | Laterality: Right

## 2019-07-06 MED ORDER — METOCLOPRAMIDE HCL 5 MG/ML IJ SOLN: 5.0000 mg | Freq: Three times a day (TID) | INTRAMUSCULAR | Status: DC | PRN

## 2019-07-06 MED ORDER — METOCLOPRAMIDE HCL 10 MG PO TABS: 5.0000 mg | ORAL_TABLET | Freq: Three times a day (TID) | ORAL | Status: DC | PRN

## 2019-07-06 MED ORDER — HYDROCODONE-ACETAMINOPHEN 5-325 MG PO TABS
1.0000 | ORAL_TABLET | ORAL | Status: DC | PRN
  Administered 2019-07-06: 2 via ORAL
  Filled 2019-07-06: qty 2

## 2019-07-06 MED ORDER — HYDROCODONE-ACETAMINOPHEN 7.5-325 MG PO TABS
1.0000 | ORAL_TABLET | Freq: Once | ORAL | Status: AC
  Administered 2019-07-06: 1 via ORAL
  Filled 2019-07-06: qty 1

## 2019-07-06 MED ORDER — DOCUSATE SODIUM 100 MG PO CAPS
100.0000 mg | ORAL_CAPSULE | Freq: Two times a day (BID) | ORAL | Status: DC
  Administered 2019-07-06 – 2019-07-08 (×5): 100 mg via ORAL
  Filled 2019-07-06 (×5): qty 1

## 2019-07-06 MED ORDER — POVIDONE-IODINE 10 % EX SWAB: 2.0000 "application " | Freq: Once | CUTANEOUS | Status: DC

## 2019-07-06 MED ORDER — CEFAZOLIN SODIUM-DEXTROSE 1-4 GM/50ML-% IV SOLN
INTRAVENOUS | Status: AC
  Filled 2019-07-06: qty 50

## 2019-07-06 MED ORDER — BUPIVACAINE-EPINEPHRINE (PF) 0.5% -1:200000 IJ SOLN
INTRAMUSCULAR | Status: DC | PRN
  Administered 2019-07-06: 60 mL via PERINEURAL

## 2019-07-06 MED ORDER — TRAMADOL HCL 50 MG PO TABS
50.0000 mg | ORAL_TABLET | Freq: Four times a day (QID) | ORAL | Status: DC
  Administered 2019-07-06 – 2019-07-07 (×3): 50 mg via ORAL
  Filled 2019-07-06 (×3): qty 1

## 2019-07-06 MED ORDER — FENTANYL CITRATE (PF) 250 MCG/5ML IJ SOLN
INTRAMUSCULAR | Status: AC
  Filled 2019-07-06: qty 5

## 2019-07-06 MED ORDER — ALBUTEROL SULFATE HFA 108 (90 BASE) MCG/ACT IN AERS
INHALATION_SPRAY | RESPIRATORY_TRACT | Status: AC
  Filled 2019-07-06: qty 6.7

## 2019-07-06 MED ORDER — ROCURONIUM BROMIDE 10 MG/ML (PF) SYRINGE
PREFILLED_SYRINGE | INTRAVENOUS | Status: AC
  Filled 2019-07-06: qty 10

## 2019-07-06 MED ORDER — HYDROCODONE-ACETAMINOPHEN 7.5-325 MG PO TABS: 1.0000 | ORAL_TABLET | Freq: Once | ORAL | Status: DC | PRN

## 2019-07-06 MED ORDER — SUGAMMADEX SODIUM 500 MG/5ML IV SOLN
INTRAVENOUS | Status: DC | PRN
  Administered 2019-07-06: 200 mg via INTRAVENOUS

## 2019-07-06 MED ORDER — ONDANSETRON HCL 4 MG/2ML IJ SOLN
4.0000 mg | Freq: Once | INTRAMUSCULAR | Status: AC
  Administered 2019-07-06: 4 mg via INTRAVENOUS
  Filled 2019-07-06: qty 2

## 2019-07-06 MED ORDER — PREGABALIN 50 MG PO CAPS
50.0000 mg | ORAL_CAPSULE | Freq: Once | ORAL | Status: AC
  Administered 2019-07-06: 50 mg via ORAL
  Filled 2019-07-06: qty 1

## 2019-07-06 MED ORDER — SUCCINYLCHOLINE CHLORIDE 200 MG/10ML IV SOSY
PREFILLED_SYRINGE | INTRAVENOUS | Status: AC
  Filled 2019-07-06: qty 10

## 2019-07-06 MED ORDER — CELECOXIB 400 MG PO CAPS
400.0000 mg | ORAL_CAPSULE | Freq: Once | ORAL | Status: AC
  Administered 2019-07-06: 400 mg via ORAL
  Filled 2019-07-06: qty 1

## 2019-07-06 MED ORDER — SUCCINYLCHOLINE CHLORIDE 20 MG/ML IJ SOLN
INTRAMUSCULAR | Status: DC | PRN
  Administered 2019-07-06: 120 mg via INTRAVENOUS

## 2019-07-06 MED ORDER — LACTATED RINGERS IV SOLN: INTRAVENOUS | Status: DC

## 2019-07-06 MED ORDER — SODIUM CHLORIDE 0.9 % IV SOLN: INTRAVENOUS | Status: DC

## 2019-07-06 MED ORDER — PROPOFOL 10 MG/ML IV BOLUS
INTRAVENOUS | Status: DC | PRN
  Administered 2019-07-06: 180 ug via INTRAVENOUS

## 2019-07-06 MED ORDER — PROPOFOL 10 MG/ML IV BOLUS
INTRAVENOUS | Status: AC
  Filled 2019-07-06: qty 20

## 2019-07-06 MED ORDER — CEFAZOLIN SODIUM-DEXTROSE 2-4 GM/100ML-% IV SOLN
2.0000 g | Freq: Four times a day (QID) | INTRAVENOUS | Status: AC
  Administered 2019-07-06 – 2019-07-07 (×3): 2 g via INTRAVENOUS
  Filled 2019-07-06 (×2): qty 100

## 2019-07-06 MED ORDER — MUPIROCIN 2 % EX OINT
1.0000 "application " | TOPICAL_OINTMENT | Freq: Two times a day (BID) | CUTANEOUS | Status: DC
  Administered 2019-07-06 – 2019-07-07 (×3): 1 via NASAL
  Filled 2019-07-06: qty 22

## 2019-07-06 MED ORDER — DIPHENHYDRAMINE HCL 12.5 MG/5ML PO ELIX: 12.5000 mg | ORAL_SOLUTION | ORAL | Status: DC | PRN

## 2019-07-06 MED ORDER — ACETAMINOPHEN 500 MG PO TABS
500.0000 mg | ORAL_TABLET | Freq: Four times a day (QID) | ORAL | Status: DC
  Administered 2019-07-06 – 2019-07-07 (×3): 500 mg via ORAL
  Filled 2019-07-06 (×3): qty 1

## 2019-07-06 MED ORDER — MORPHINE SULFATE (PF) 2 MG/ML IV SOLN: 0.5000 mg | INTRAVENOUS | Status: DC | PRN

## 2019-07-06 MED ORDER — FENTANYL CITRATE (PF) 100 MCG/2ML IJ SOLN
INTRAMUSCULAR | Status: DC | PRN
  Administered 2019-07-06: 50 ug via INTRAVENOUS
  Administered 2019-07-06 (×2): 100 ug via INTRAVENOUS
  Administered 2019-07-06: 50 ug via INTRAVENOUS

## 2019-07-06 MED ORDER — HYDROMORPHONE HCL 1 MG/ML IJ SOLN: 0.2500 mg | INTRAMUSCULAR | Status: DC | PRN

## 2019-07-06 MED ORDER — ENOXAPARIN SODIUM 30 MG/0.3ML ~~LOC~~ SOLN
30.0000 mg | SUBCUTANEOUS | Status: DC
  Administered 2019-07-07: 30 mg via SUBCUTANEOUS
  Filled 2019-07-06: qty 0.3

## 2019-07-06 MED ORDER — BISACODYL 5 MG PO TBEC: 5.0000 mg | DELAYED_RELEASE_TABLET | Freq: Every day | ORAL | Status: DC | PRN

## 2019-07-06 MED ORDER — DEXTROSE 5 % IV SOLN
3.0000 g | INTRAVENOUS | Status: AC
  Administered 2019-07-06: 3 g via INTRAVENOUS
  Filled 2019-07-06: qty 3000

## 2019-07-06 MED ORDER — DEXAMETHASONE SODIUM PHOSPHATE 10 MG/ML IJ SOLN
INTRAMUSCULAR | Status: AC
  Filled 2019-07-06: qty 1

## 2019-07-06 MED ORDER — ONDANSETRON HCL 4 MG/2ML IJ SOLN
INTRAMUSCULAR | Status: AC
  Filled 2019-07-06: qty 2

## 2019-07-06 MED ORDER — MIDAZOLAM HCL 2 MG/2ML IJ SOLN: 0.5000 mg | Freq: Once | INTRAMUSCULAR | Status: DC | PRN

## 2019-07-06 MED ORDER — CEFAZOLIN SODIUM-DEXTROSE 2-4 GM/100ML-% IV SOLN
INTRAVENOUS | Status: AC
  Filled 2019-07-06: qty 100

## 2019-07-06 MED ORDER — ROCURONIUM BROMIDE 100 MG/10ML IV SOLN
INTRAVENOUS | Status: DC | PRN
  Administered 2019-07-06: 35 mg via INTRAVENOUS

## 2019-07-06 MED ORDER — CELECOXIB 100 MG PO CAPS
200.0000 mg | ORAL_CAPSULE | Freq: Two times a day (BID) | ORAL | Status: DC
  Administered 2019-07-06 – 2019-07-07 (×2): 200 mg via ORAL
  Filled 2019-07-06 (×2): qty 2

## 2019-07-06 MED ORDER — SENNOSIDES-DOCUSATE SODIUM 8.6-50 MG PO TABS: 1.0000 | ORAL_TABLET | Freq: Every evening | ORAL | Status: DC | PRN

## 2019-07-06 MED ORDER — LACTATED RINGERS IV SOLN: INTRAVENOUS | Status: DC | PRN

## 2019-07-06 MED ORDER — ONDANSETRON HCL 4 MG/2ML IJ SOLN
INTRAMUSCULAR | Status: DC | PRN
  Administered 2019-07-06: 4 mg via INTRAVENOUS

## 2019-07-06 MED ORDER — PROMETHAZINE HCL 25 MG/ML IJ SOLN: 6.2500 mg | INTRAMUSCULAR | Status: DC | PRN

## 2019-07-06 MED ORDER — CHLORHEXIDINE GLUCONATE 4 % EX LIQD
60.0000 mL | Freq: Once | CUTANEOUS | Status: AC
  Administered 2019-07-06: 4 via TOPICAL
  Filled 2019-07-06: qty 60
  Filled 2019-07-06: qty 15

## 2019-07-06 MED ORDER — HYDROCODONE-ACETAMINOPHEN 7.5-325 MG PO TABS: 1.0000 | ORAL_TABLET | ORAL | Status: DC | PRN

## 2019-07-06 MED ORDER — 0.9 % SODIUM CHLORIDE (POUR BTL) OPTIME
TOPICAL | Status: DC | PRN
  Administered 2019-07-06: 1000 mL

## 2019-07-06 MED ORDER — ACETAMINOPHEN 325 MG PO TABS: 325.0000 mg | ORAL_TABLET | Freq: Four times a day (QID) | ORAL | Status: DC | PRN

## 2019-07-06 MED ORDER — BUPIVACAINE-EPINEPHRINE (PF) 0.5% -1:200000 IJ SOLN
INTRAMUSCULAR | Status: AC
  Filled 2019-07-06: qty 60

## 2019-07-06 MED ORDER — DEXAMETHASONE SODIUM PHOSPHATE 10 MG/ML IJ SOLN
INTRAMUSCULAR | Status: DC | PRN
  Administered 2019-07-06: 4 mg via INTRAVENOUS

## 2019-07-06 MED ORDER — MAGNESIUM CITRATE PO SOLN: 1.0000 | Freq: Once | ORAL | Status: DC | PRN

## 2019-07-06 SURGICAL SUPPLY — 73 items
APL PRP STRL LF DISP 70% ISPRP (MISCELLANEOUS) ×1
BANDAGE ELASTIC 4 VELCRO NS (GAUZE/BANDAGES/DRESSINGS) ×4 IMPLANT
BANDAGE ESMARK 4X12 BL STRL LF (DISPOSABLE) ×1 IMPLANT
BLADE SURG SZ10 CARB STEEL (BLADE) ×3 IMPLANT
BNDG CMPR 12X4 ELC STRL LF (DISPOSABLE) ×1
BNDG CMPR STD VLCR NS LF 5.8X3 (GAUZE/BANDAGES/DRESSINGS) ×1
BNDG CMPR STD VLCR NS LF 5.8X4 (GAUZE/BANDAGES/DRESSINGS) ×2
BNDG COHESIVE 4X5 TAN STRL (GAUZE/BANDAGES/DRESSINGS) ×3 IMPLANT
BNDG ELASTIC 3X5.8 VLCR NS LF (GAUZE/BANDAGES/DRESSINGS) ×3 IMPLANT
BNDG ELASTIC 4X5.8 VLCR NS LF (GAUZE/BANDAGES/DRESSINGS) ×6 IMPLANT
BNDG ESMARK 4X12 BLUE STRL LF (DISPOSABLE) ×3
CHLORAPREP W/TINT 26 (MISCELLANEOUS) ×4 IMPLANT
CLOTH BEACON ORANGE TIMEOUT ST (SAFETY) ×3 IMPLANT
COVER LIGHT HANDLE STERIS (MISCELLANEOUS) ×6 IMPLANT
CUFF TOURN SGL QUICK 34 (TOURNIQUET CUFF) ×3
CUFF TRNQT CYL 34X4.125X (TOURNIQUET CUFF) ×1 IMPLANT
DECANTER SPIKE VIAL GLASS SM (MISCELLANEOUS) ×6 IMPLANT
DRAPE C-ARM FOLDED MOBILE STRL (DRAPES) ×3 IMPLANT
DRAPE HALF SHEET 40X57 (DRAPES) ×3 IMPLANT
DRILL 2.6X122MM WL AO SHAFT (BIT) ×2 IMPLANT
GAUZE SPONGE 4X4 12PLY STRL (GAUZE/BANDAGES/DRESSINGS) ×3 IMPLANT
GAUZE XEROFORM 5X9 LF (GAUZE/BANDAGES/DRESSINGS) ×3 IMPLANT
GLOVE BIO SURGEON STRL SZ7 (GLOVE) ×2 IMPLANT
GLOVE BIOGEL PI IND STRL 7.0 (GLOVE) ×2 IMPLANT
GLOVE BIOGEL PI INDICATOR 7.0 (GLOVE) ×4
GLOVE SKINSENSE NS SZ8.0 LF (GLOVE) ×2
GLOVE SKINSENSE STRL SZ8.0 LF (GLOVE) ×1 IMPLANT
GLOVE SS N UNI LF 8.5 STRL (GLOVE) ×3 IMPLANT
GOWN STRL REUS W/TWL LRG LVL3 (GOWN DISPOSABLE) ×6 IMPLANT
GOWN STRL REUS W/TWL XL LVL3 (GOWN DISPOSABLE) ×3 IMPLANT
INST SET MINOR BONE (KITS) ×3 IMPLANT
K-WIRE 1.4X100 (WIRE)
K-WIRE 1.6X150 ×2 IMPLANT
K-WIRE 1.6X150 (WIRE) ×6 IMPLANT
K-WIRE FX150X1.6XKRSH (WIRE) ×2
K-WIRE ORTHOPEDIC 1.4X150L ×1 IMPLANT
K-WIRE ORTHOPEDIC 1.4X150L (WIRE) ×3 IMPLANT
K-WIRE SMOOTH 2.0X150 (WIRE)
KIT TURNOVER KIT A (KITS) ×3 IMPLANT
KWIRE 1.4X100 (WIRE) IMPLANT
KWIRE FX150X1.6XKRSH (WIRE) IMPLANT
KWIRE ORTHOPEDIC 1.4X150L (WIRE) IMPLANT
KWIRE SMOOTH 2.0X150 (WIRE) IMPLANT
MANIFOLD NEPTUNE II (INSTRUMENTS) ×3 IMPLANT
NDL HYPO 21X1.5 SAFETY (NEEDLE) ×1 IMPLANT
NEEDLE HYPO 21X1.5 SAFETY (NEEDLE) ×3 IMPLANT
NS IRRIG 1000ML POUR BTL (IV SOLUTION) ×3 IMPLANT
PACK BASIC LIMB (CUSTOM PROCEDURE TRAY) ×3 IMPLANT
PAD ABD 5X9 TENDERSORB (GAUZE/BANDAGES/DRESSINGS) ×6 IMPLANT
PAD ARMBOARD 7.5X6 YLW CONV (MISCELLANEOUS) ×3 IMPLANT
PAD CAST 4YDX4 CTTN HI CHSV (CAST SUPPLIES) ×1 IMPLANT
PADDING CAST COTTON 4X4 STRL (CAST SUPPLIES) ×3
PADDING WEBRIL 4 STERILE (GAUZE/BANDAGES/DRESSINGS) ×2 IMPLANT
PLATE 10H 132MM (Plate) ×2 IMPLANT
SCREW BONE 14MMX3.5MM (Screw) ×2 IMPLANT
SCREW BONE 36MMX3.5MM (Screw) ×2 IMPLANT
SCREW BONE 50MMX3.5MM (Screw) ×2 IMPLANT
SCREW BONE NON-LCKING 3.5X12MM (Screw) ×6 IMPLANT
SCREW LOCKING 3.5X12 (Screw) ×2 IMPLANT
SCREW NLOCK 3.5X55 (Screw) ×2 IMPLANT
SCREW NONLOCK 22MM (Screw) ×2 IMPLANT
SCREW NONLOCK 24MM (Screw) ×2 IMPLANT
SET BASIN LINEN APH (SET/KITS/TRAYS/PACK) ×3 IMPLANT
SPLINT IMMOBILIZER J 3INX20FT (CAST SUPPLIES) ×2
SPLINT J IMMOBILIZER 3X20FT (CAST SUPPLIES) IMPLANT
SPONGE LAP 18X18 RF (DISPOSABLE) ×3 IMPLANT
STAPLER VISISTAT 35W (STAPLE) ×3 IMPLANT
SUT MON AB 0 CT1 (SUTURE) ×3 IMPLANT
SUT MON AB 2-0 CT1 36 (SUTURE) IMPLANT
SUT VIC AB 1 CT1 27 (SUTURE) ×3
SUT VIC AB 1 CT1 27XBRD ANTBC (SUTURE) IMPLANT
SYR 30ML LL (SYRINGE) ×3 IMPLANT
SYR BULB IRRIGATION 50ML (SYRINGE) ×3 IMPLANT

## 2019-07-06 NOTE — ED Notes (Signed)
Moist sponge given to wet pt's mouth.

## 2019-07-06 NOTE — ED Notes (Signed)
Transfer to surgery

## 2019-07-06 NOTE — Consult Note (Signed)
Reason for Consult: right ankle fracture  Referring Physician: Dr Noemi Chapel   Nicole Sampson is an 68 y.o. female.  HPI: She fell at home. Just finished cancer treatment. She slipped in the kitchen.  Pain is located right ankle  DOI: 07/05/2019 Worse with movement  INTENSITY: 3/10   Past Medical History:  Diagnosis Date  . Arthritis    knees  . Full dentures   . GERD (gastroesophageal reflux disease)   . Hypertension    followed by pcp  (05-10-2019  per pt never had a stress test)  . PMB (postmenopausal bleeding)   . Type 2 diabetes mellitus ([place])    followed by pcp  (05-10-2019 check's cbg twice weekly,  fasting cbg-- 103)  . Wears glasses     Past Surgical History:  Procedure Laterality Date  . BREAST MASS EXCISION Left 04/1996  . HYSTEROSCOPY WITH D & C N/A 05/11/2019   Procedure: DILATATION AND CURETTAGE /HYSTEROSCOPY;  Surgeon: Vanessa Kick, MD;  Location: [place];  Service: Gynecology;  Laterality: N/A;  . ROBOTIC ASSISTED TOTAL HYSTERECTOMY WITH BILATERAL SALPINGO OOPHERECTOMY Bilateral 06/15/2019   Procedure: XI ROBOTIC ASSISTED TOTAL HYSTERECTOMY GREATER THAN 250 GRAMS, WITH BILATERAL SALPINGO OOPHORECTOMY;  Surgeon: Everitt Amber, MD;  Location: [facility];  Service: Gynecology;  Laterality: Bilateral;  . SENTINEL NODE BIOPSY N/A 06/15/2019   Procedure: SENTINEL NODE BIOPSY;  Surgeon: Everitt Amber, MD;  Location: [facility];  Service: Gynecology;  Laterality: N/A;    Family History  Problem Relation Age of Onset  . Uterine cancer Mother   . Diabetes Mother   . Hypertension Mother   . Diabetes Father   . Hypertension Brother     Social History:  reports that she has never smoked. She has never used smokeless tobacco. She reports that she does not drink alcohol or use drugs.  Allergies:  Allergies  Allergen Reactions  . Other Swelling and Other (See Comments)    "lime jello causes swelling"    Medications: I have reviewed the patient's current  medications.  Results for orders placed or performed during the hospital encounter of 07/05/19 (from the past 48 hour(s))  CBC     Status: Abnormal   Collection Time: 07/05/19  3:40 PM  Result Value Ref Range   WBC 10.1 4.0 - 10.5 K/uL   RBC 3.97 3.87 - 5.11 MIL/uL   Hemoglobin 11.2 (L) 12.0 - 15.0 g/dL   HCT 35.6 (L) 36.0 - 46.0 %   MCV 89.7 80.0 - 100.0 fL   MCH 28.2 26.0 - 34.0 pg   MCHC 31.5 30.0 - 36.0 g/dL   RDW 14.4 11.5 - 15.5 %   Platelets 229 150 - 400 K/uL   nRBC 0.0 0.0 - 0.2 %    Comment: Performed at [facility], [address], [place], [place] [address]  Basic metabolic panel     Status: Abnormal   Collection Time: 07/05/19  3:40 PM  Result Value Ref Range   Sodium 136 135 - 145 mmol/L   Potassium 4.1 3.5 - 5.1 mmol/L   Chloride 102 98 - 111 mmol/L   CO2 23 22 - 32 mmol/L   Glucose, Bld 149 (H) 70 - 99 mg/dL   BUN 34 (H) 8 - 23 mg/dL   Creatinine, Ser 1.39 (H) 0.44 - 1.00 mg/dL   Calcium 8.9 8.9 - 10.3 mg/dL   GFR calc non Af Amer 39 (L) >60 mL/min   GFR calc Af Amer 46 (L) >  60 mL/min   Anion gap 11 5 - 15    Comment: Performed at [facility], [address] [place], [place] [address]  Type and screen     Status: None   Collection Time: 07/05/19  3:40 PM  Result Value Ref Range   ABO/RH(D) A NEG    Antibody Screen NEG    Sample Expiration      07/08/2019,2359 Performed at [facility], [address] [place], [place] [address]   POC SARS Coronavirus 2 Ag-ED - Nasal Swab (BD Veritor Kit)     Status: None   Collection Time: 07/05/19  6:41 PM  Result Value Ref Range   SARS Coronavirus 2 Ag NEGATIVE NEGATIVE    Comment: (NOTE) SARS-CoV-2 antigen NOT DETECTED.  Negative results are presumptive.  Negative results do not preclude SARS-CoV-2 infection and should not be used as the sole basis for treatment or other patient management decisions, including infection  control decisions, particularly in the presence of clinical signs and  symptoms consistent  with COVID-19, or in those who have been in contact with the virus.  Negative results must be combined with clinical observations, patient history, and epidemiological information. The expected result is Negative. Fact Sheet for Patients: PodPark.tn Fact Sheet for Healthcare Providers: GiftContent.is This test is not yet approved or cleared by the [place] FDA and  has been authorized for detection and/or diagnosis of SARS-CoV-2 by FDA under an Emergency Use Authorization (EUA).  This EUA will remain in effect (meaning this test can be used) for the duration of  the COVID-19 de claration under Section 564(b)(1) of the Act, 21 U.S.C. section 360bbb-3(b)(1), unless the authorization is terminated or revoked sooner.   Respiratory Panel by RT PCR (Flu A&B, Covid) - Nasopharyngeal Swab     Status: None   Collection Time: 07/05/19  8:36 PM   Specimen: Nasopharyngeal Swab  Result Value Ref Range   SARS Coronavirus 2 by RT PCR NEGATIVE NEGATIVE    Comment: (NOTE) SARS-CoV-2 target nucleic acids are NOT DETECTED. The SARS-CoV-2 RNA is generally detectable in upper respiratoy specimens during the acute phase of infection. The lowest concentration of SARS-CoV-2 viral copies this assay can detect is 131 copies/mL. A negative result does not preclude SARS-Cov-2 infection and should not be used as the sole basis for treatment or other patient management decisions. A negative result may occur with  improper specimen collection/handling, submission of specimen other than nasopharyngeal swab, presence of viral mutation(s) within the areas targeted by this assay, and inadequate number of viral copies (<131 copies/mL). A negative result must be combined with clinical observations, patient history, and epidemiological information. The expected result is Negative. Fact Sheet for Patients:  PinkCheek.be Fact  Sheet for Healthcare Providers:  GravelBags.it This test is not yet ap proved or cleared by the [place] FDA and  has been authorized for detection and/or diagnosis of SARS-CoV-2 by FDA under an Emergency Use Authorization (EUA). This EUA will remain  in effect (meaning this test can be used) for the duration of the COVID-19 declaration under Section 564(b)(1) of the Act, 21 U.S.C. section 360bbb-3(b)(1), unless the authorization is terminated or revoked sooner.    Influenza A by PCR NEGATIVE NEGATIVE   Influenza B by PCR NEGATIVE NEGATIVE    Comment: (NOTE) The Xpert Xpress SARS-CoV-2/FLU/RSV assay is intended as an aid in  the diagnosis of influenza from Nasopharyngeal swab specimens and  should not be used as a sole basis for treatment. Nasal washings  and  aspirates are unacceptable for Xpert Xpress SARS-CoV-2/FLU/RSV  testing. Fact Sheet for Patients: PinkCheek.be Fact Sheet for Healthcare Providers: GravelBags.it This test is not yet approved or cleared by the [place] FDA and  has been authorized for detection and/or diagnosis of SARS-CoV-2 by  FDA under an Emergency Use Authorization (EUA). This EUA will remain  in effect (meaning this test can be used) for the duration of the  Covid-19 declaration under Section 564(b)(1) of the Act, 21  U.S.C. section 360bbb-3(b)(1), unless the authorization is  terminated or revoked. Performed at [facility], [address], [place], [place] [address]   Surgical PCR screen     Status: None   Collection Time: 07/06/19  5:19 AM   Specimen: Nasal Mucosa; Nasal Swab  Result Value Ref Range   MRSA, PCR NEGATIVE NEGATIVE   Staphylococcus aureus NEGATIVE NEGATIVE    Comment: (NOTE) The Xpert SA Assay (FDA approved for NASAL specimens in patients 28 years of age and older), is one component of a comprehensive surveillance program. It is not intended  to diagnose infection nor to guide or monitor treatment. Performed at [facility], [address] [place], [place] [address]     DG Ankle Complete Right  Result Date: 07/05/2019 CLINICAL DATA:  Post reduction right ankle. EXAM: RIGHT ANKLE - COMPLETE 3+ VIEW COMPARISON:  Prior study same day. FINDINGS: Patient is casted. Displaced fractures of the distal right fibula, medial malleolus, posterior malleolus noted. Tibiotalar joint is disrupted with lateral subluxation of the talus again noted. IMPRESSION: Patient is casted. Displaced trimalleolar fractures with disruption of the tibiotalar joint and lateral subluxation of the talus again noted. Similar findings on prior exam. Electronically Signed   By: Marcello Moores  Register   On: 07/05/2019 15:05   DG Ankle Complete Right  Result Date: 07/05/2019 CLINICAL DATA:  Fall today EXAM: RIGHT ANKLE - COMPLETE 3+ VIEW COMPARISON:  None. FINDINGS: Trimalleolar ankle fracture. Lateral subluxation of the talus. Fracture distal fibula, medial malleolus, and posterior malleolus. IMPRESSION: Trimalleolar fracture dislocation of the ankle. Electronically Signed   By: Franchot Gallo M.D.   On: 07/05/2019 14:19   DG Pelvis Portable  Result Date: 07/05/2019 CLINICAL DATA:  68 year old female with fall and right hip pain. EXAM: PORTABLE PELVIS 1-2 VIEWS COMPARISON:  None. FINDINGS: Evaluation is limited due to body habitus and soft tissue attenuation. No definite acute fracture identified. There is no dislocation. No significant arthritic changes. There is degenerative changes of the lower lumbar spine. The soft tissues are grossly unremarkable. IMPRESSION: No definite acute fracture or dislocation. Electronically Signed   By: Anner Crete M.D.   On: 07/05/2019 22:25   DG Chest Port 1 View  Result Date: 07/05/2019 CLINICAL DATA:  Preoperative exam. EXAM: PORTABLE CHEST 1 VIEW COMPARISON:  No prior. FINDINGS: Mediastinum and hilar structures are normal.  Cardiomegaly. No pulmonary venous congestion. No focal infiltrate. No pleural effusion or pneumothorax. Degenerative change thoracic spine. IMPRESSION: Cardiomegaly.  No pulmonary venous congestion.  No acute infiltrate. Electronically Signed   By: Marcello Moores  Register   On: 07/05/2019 15:56    Review of Systems  Neurological: Negative for numbness.  All other systems reviewed and are negative.  Blood pressure (!) 126/57, pulse 75, temperature 98.5 F (36.9 C), temperature source Oral, resp. rate 20, height '5\' 9"'  (1.753 m), weight 122.5 kg, SpO2 100 %. Physical Exam  Constitutional: She is oriented to person, place, and time. She appears well-developed and well-nourished.  bmi > 40  Musculoskeletal:     Comments: Left ankle:  Normal rom , strength normal, alignment normal, stable joints Skin normal sensory intact pulse perfusion normal   RIGHT ANKLE  ALIGNMENT ABNORMAL TENDER MEDIAL AND LATERAL  ROM NONE: MOVES HER TOES OK UNSTABLE ANKLE  SKIN UNKNOWN IN SPLINT COLOR CAP REFILL NORMAL  SENSATION NORMAL    Neurological: She is alert and oriented to person, place, and time.  Psychiatric: She has a normal mood and affect. Her behavior is normal. Judgment and thought content normal.    Assessment/Plan: CLOSED TRIMALLEOLAR RIGHT ANKLE FRACTURE   The procedure has been fully reviewed with the patient; The risks and benefits of surgery have been discussed and explained and understood. Alternative treatment has also been reviewed, questions were encouraged and answered. The postoperative plan is also been reviewed.  CONSENT ELEMENTS  1. Clinical issues (DX) CL RT ANKLE TRIMALL FRACTURE IN A 68 YO FEMALE WITH DIABETES S/P RECENT CANCER SURGERY  2. Options CLOSED RX VS ORIF  3. Pros and cons UNSTABLE LIKELY TO FAIL CLOSED TREATMENT /  OPEN TREATMENT RISK OF INFECTION LOSS OF REDUCTION AND HARDWARE FAILURE AND EVEN AMPUTATION  4. Uncertainty with outcomes - YES  5. Assess px understanding  YES 6. Discern/assess patient preference SHE AGREES TO PROCEED WITH SURGERY   Risks and benefits and complications explained including alternative treatments. The patient has opted for surgical treatment   OPEN REDUCTION INTERNAL FIXATION RIGHT ANKLE   Arther Abbott 07/06/2019, 9:27 AM

## 2019-07-06 NOTE — Progress Notes (Signed)
PROGRESS NOTE Nicole Sampson   Nicole Sampson  [id number]  DOB: 18-Mar-1951  DOA: 07/05/2019 PCP: Berenice Primas   Brief Admission Hx: 68 y.o. female with medical history significant for diabetes mellitus, hypertension, endometrial cancer.  Patient reports that she was walking when her leg slipped and she fell on her right lower extremity.  She has had persistent pain since then.  MDM/Assessment & Plan:   1. Trimalleolar right ankle fracture - pt was seen by orthopedics and going to OR for ORIF of right ankle fracture.   2. Essential hypertension - presented with soft pressures, temporarily holding HCTZ and losartan.  3. Type 2 DM - continue SSI coverage and CBG testing.  4. Endometrial cancer - s/p recent robotic hysterectomy and salpingo-oophorectomy 06/15/19.   DVT prophylaxis: SCDs Code Status: Full  Family Communication: patient updated  Disposition Plan: inpatient for OR management   Consultants:  Orthopedics   Procedures:  ORIF right ankle 07/06/19  Antimicrobials:     Subjective: Pt reports pain only with minimal movement of right ankle.   Objective: Vitals:   07/06/19 0500 07/06/19 0600 07/06/19 0753 07/06/19 1036  BP: (!) 126/57 114/69 (!) 126/57 132/60  Pulse: 67 76 75 78  Resp: 14 17 20 20   Temp:   98.5 F (36.9 C)   TempSrc:   Oral   SpO2: 95% 94% 100% 98%  Weight:      Height:       No intake or output data in the 24 hours ending 07/06/19 1127 Filed Weights   07/05/19 1355  Weight: 122.5 kg   REVIEW OF SYSTEMS  As per history otherwise all reviewed and reported negative  Exam:  General exam: sitting up in bed, awake, alert, NAD, cooperative.  Respiratory system: Clear. No increased work of breathing. Cardiovascular system: S1 & S2 heard. No JVD, murmurs, gallops, clicks or pedal edema. Gastrointestinal system: Abdomen is nondistended, soft and nontender. Normal bowel sounds heard. Central nervous system: Alert and  oriented. No focal neurological deficits. Extremities: right ankle casted. Good pulses bilateral LEs.   Data Reviewed: Basic Metabolic Panel: Recent Labs  Lab 07/05/19 1540  NA 136  K 4.1  CL 102  CO2 23  GLUCOSE 149*  BUN 34*  CREATININE 1.39*  CALCIUM 8.9   Liver Function Tests: No results for input(s): AST, ALT, ALKPHOS, BILITOT, PROT, ALBUMIN in the last 168 hours. No results for input(s): LIPASE, AMYLASE in the last 168 hours. No results for input(s): AMMONIA in the last 168 hours. CBC: Recent Labs  Lab 07/05/19 1540  WBC 10.1  HGB 11.2*  HCT 35.6*  MCV 89.7  PLT 229   Cardiac Enzymes: No results for input(s): CKTOTAL, CKMB, CKMBINDEX, TROPONINI in the last 168 hours. CBG (last 3)  No results for input(s): GLUCAP in the last 72 hours. Recent Results (from the past 240 hour(s))  Respiratory Panel by RT PCR (Flu A&B, Covid) - Nasopharyngeal Swab     Status: None   Collection Time: 07/05/19  8:36 PM   Specimen: Nasopharyngeal Swab  Result Value Ref Range Status   SARS Coronavirus 2 by RT PCR NEGATIVE NEGATIVE Final    Comment: (NOTE) SARS-CoV-2 target nucleic acids are NOT DETECTED. The SARS-CoV-2 RNA is generally detectable in upper respiratoy specimens during the acute phase of infection. The lowest concentration of SARS-CoV-2 viral copies this assay can detect is 131 copies/mL. A negative result does not preclude SARS-Cov-2 infection and should not be used as the sole  basis for treatment or other patient management decisions. A negative result may occur with  improper specimen collection/handling, submission of specimen other than nasopharyngeal swab, presence of viral mutation(s) within the areas targeted by this assay, and inadequate number of viral copies (<131 copies/mL). A negative result must be combined with clinical observations, patient history, and epidemiological information. The expected result is Negative. Fact Sheet for Patients:    PinkCheek.be Fact Sheet for Healthcare Providers:  GravelBags.it This test is not yet ap proved or cleared by the [place] FDA and  has been authorized for detection and/or diagnosis of SARS-CoV-2 by FDA under an Emergency Use Authorization (EUA). This EUA will remain  in effect (meaning this test can be used) for the duration of the COVID-19 declaration under Section 564(b)(1) of the Act, 21 U.S.C. section 360bbb-3(b)(1), unless the authorization is terminated or revoked sooner.    Influenza A by PCR NEGATIVE NEGATIVE Final   Influenza B by PCR NEGATIVE NEGATIVE Final    Comment: (NOTE) The Xpert Xpress SARS-CoV-2/FLU/RSV assay is intended as an aid in  the diagnosis of influenza from Nasopharyngeal swab specimens and  should not be used as a sole basis for treatment. Nasal washings and  aspirates are unacceptable for Xpert Xpress SARS-CoV-2/FLU/RSV  testing. Fact Sheet for Patients: PinkCheek.be Fact Sheet for Healthcare Providers: GravelBags.it This test is not yet approved or cleared by the [place] FDA and  has been authorized for detection and/or diagnosis of SARS-CoV-2 by  FDA under an Emergency Use Authorization (EUA). This EUA will remain  in effect (meaning this test can be used) for the duration of the  Covid-19 declaration under Section 564(b)(1) of the Act, 21  U.S.C. section 360bbb-3(b)(1), unless the authorization is  terminated or revoked. Performed at [facility], [address], [place], [place] [address]   Surgical PCR screen     Status: None   Collection Time: 07/06/19  5:19 AM   Specimen: Nasal Mucosa; Nasal Swab  Result Value Ref Range Status   MRSA, PCR NEGATIVE NEGATIVE Final   Staphylococcus aureus NEGATIVE NEGATIVE Final    Comment: (NOTE) The Xpert SA Assay (FDA approved for NASAL specimens in patients 22 years of age and  older), is one component of a comprehensive surveillance program. It is not intended to diagnose infection nor to guide or monitor treatment. Performed at [facility], [address] [place], [place] [address]      Studies: DG Ankle Complete Right  Result Date: 07/05/2019 CLINICAL DATA:  Post reduction right ankle. EXAM: RIGHT ANKLE - COMPLETE 3+ VIEW COMPARISON:  Prior study same day. FINDINGS: Patient is casted. Displaced fractures of the distal right fibula, medial malleolus, posterior malleolus noted. Tibiotalar joint is disrupted with lateral subluxation of the talus again noted. IMPRESSION: Patient is casted. Displaced trimalleolar fractures with disruption of the tibiotalar joint and lateral subluxation of the talus again noted. Similar findings on prior exam. Electronically Signed   By: Marcello Moores  Register   On: 07/05/2019 15:05   DG Ankle Complete Right  Result Date: 07/05/2019 CLINICAL DATA:  Fall today EXAM: RIGHT ANKLE - COMPLETE 3+ VIEW COMPARISON:  None. FINDINGS: Trimalleolar ankle fracture. Lateral subluxation of the talus. Fracture distal fibula, medial malleolus, and posterior malleolus. IMPRESSION: Trimalleolar fracture dislocation of the ankle. Electronically Signed   By: Franchot Gallo M.D.   On: 07/05/2019 14:19   DG Pelvis Portable  Result Date: 07/05/2019 CLINICAL DATA:  68 year old female with fall and right hip pain. EXAM: PORTABLE  PELVIS 1-2 VIEWS COMPARISON:  None. FINDINGS: Evaluation is limited due to body habitus and soft tissue attenuation. No definite acute fracture identified. There is no dislocation. No significant arthritic changes. There is degenerative changes of the lower lumbar spine. The soft tissues are grossly unremarkable. IMPRESSION: No definite acute fracture or dislocation. Electronically Signed   By: Anner Crete M.D.   On: 07/05/2019 22:25   DG Chest Port 1 View  Result Date: 07/05/2019 CLINICAL DATA:  Preoperative exam. EXAM: PORTABLE  CHEST 1 VIEW COMPARISON:  No prior. FINDINGS: Mediastinum and hilar structures are normal. Cardiomegaly. No pulmonary venous congestion. No focal infiltrate. No pleural effusion or pneumothorax. Degenerative change thoracic spine. IMPRESSION: Cardiomegaly.  No pulmonary venous congestion.  No acute infiltrate. Electronically Signed   By: Marcello Moores  Register   On: 07/05/2019 15:56   Scheduled Meds: . [MAR Hold] mupirocin ointment  1 application Nasal BID  . povidone-iodine  2 application Topical Once   Continuous Infusions:  Active Problems:   Closed right ankle fracture   Time spent:   Irwin Brakeman, MD [facility] 07/06/2019, 11:27 AM    LOS: 1 day  How to contact the [facility] Attending or Consulting provider [place] or covering provider during after hours [place], for this patient?  1. Check the care team in [facility] and look for a) attending/consulting [facility] provider listed and b) the [facility] team listed 2. Log into [email or web address] and use [place]'s universal password to access. If you do not have the password, please contact the hospital operator. 3. Locate the [facility] provider you are looking for under [facility] and page to a number that you can be directly reached. 4. If you still have difficulty reaching the provider, please page the [facility] (Director on Call) for the Hospitalists listed on amion for assistance.

## 2019-07-06 NOTE — Anesthesia Preprocedure Evaluation (Addendum)
Anesthesia Evaluation  Patient identified by MRN, date of birth, ID band Patient awake    Reviewed: Allergy & Precautions, NPO status , Patient's Chart, lab work & pertinent test results  Airway Mallampati: II  TM Distance: >3 FB Neck ROM: Full    Dental no notable dental hx. (+) Edentulous Upper   Pulmonary neg pulmonary ROS,    Pulmonary exam normal breath sounds clear to auscultation       Cardiovascular Exercise Tolerance: Good hypertension, Pt. on medications + DOE  Normal cardiovascular examI Rhythm:Regular Rate:Normal     Neuro/Psych negative neurological ROS  negative psych ROS   GI/Hepatic Neg liver ROS, GERD  Medicated and Controlled,  Endo/Other  diabetes, Type 2, Oral Hypoglycemic AgentsMorbid obesity  Renal/GU negative Renal ROS  negative genitourinary   Musculoskeletal  (+) Arthritis , Osteoarthritis,    Abdominal   Peds negative pediatric ROS (+)  Hematology negative hematology ROS (+)   Anesthesia Other Findings S/p robotic surg without issues 3 weeks prior   Reproductive/Obstetrics negative OB ROS                            Anesthesia Physical Anesthesia Plan  ASA: III  Anesthesia Plan: General   Post-op Pain Management:    Induction: Intravenous  PONV Risk Score and Plan: 3 and Ondansetron, Dexamethasone and Treatment may vary due to age or medical condition  Airway Management Planned: Oral ETT  Additional Equipment:   Intra-op Plan:   Post-operative Plan: Extubation in OR  Informed Consent: I have reviewed the patients History and Physical, chart, labs and discussed the procedure including the risks, benefits and alternatives for the proposed anesthesia with the patient or authorized representative who has indicated his/her understanding and acceptance.     Dental advisory given  Plan Discussed with: CRNA  Anesthesia Plan Comments: (Plan Full PPE  use Plan GETA D/W PT -WTP with same after Q&A)        Anesthesia Quick Evaluation

## 2019-07-06 NOTE — Anesthesia Postprocedure Evaluation (Signed)
Anesthesia Post Note  Patient: Nicole Sampson  Procedure(s) Performed: OPEN REDUCTION INTERNAL FIXATION (ORIF) ANKLE FRACTURE (Right Ankle)  Patient location during evaluation: PACU Anesthesia Type: General Level of consciousness: awake, oriented, sedated and patient cooperative Pain management: pain level controlled Vital Signs Assessment: post-procedure vital signs reviewed and stable Respiratory status: spontaneous breathing Cardiovascular status: blood pressure returned to baseline Postop Assessment: no headache Anesthetic complications: no     Last Vitals:  Vitals:   07/06/19 1301 07/06/19 1317  BP: (!) 166/94 (!) 183/77  Pulse:  97  Resp:  14  Temp: 37 C   SpO2:  98%    Last Pain:  Vitals:   07/06/19 1307  TempSrc:   PainSc: 0-No pain        RLE Motor Response: Purposeful movement;No tremor;Responds to commands (07/06/19 1307) RLE Sensation: No tingling;No pain;No numbness;Full sensation (07/06/19 1307)      Lenice Llamas

## 2019-07-06 NOTE — Brief Op Note (Signed)
07/06/2019  1:09 PM  PATIENT:  Nicole Sampson  68 y.o. female  PRE-OPERATIVE DIAGNOSIS:  closed trimalleolar right ankle fracture  POST-OPERATIVE DIAGNOSIS:  closed trimalleolar right ankle fracture  PROCEDURE:  Procedure(s): OPEN REDUCTION INTERNAL FIXATION (ORIF) ANKLE FRACTURE (Right)  SURGEON:  Surgeon(s) and Role:    Carole Civil, MD - Primary  Implants Stryker ankle solutions 10 hole one third tubular plate laterally with multiple screws including 1 threaded K wire which was left in place on purpose and to medial K wires  In the 1 hole tubular plate the distal screw was a locking screw  Details of surgery  Ms. Pinheiro was brought to the operating room where she had general anesthesia she was placed on the operating table in the supine position with 4 blankets under the right leg and two 5 pound sandbag under the right hip  After sterile prep and drape the tourniquet was elevated after limb exsanguination with 4 inch Esmarch  We approached the medial side first  I made a curvilinear incision in standard fashion down to bone carried it distally used blunt dissection to expose the fracture site.  The joint was visible irrigated and found to have no bone in it but there was some soft tissue periosteum interposed.  The periosteum over the tibia was destroyed by the fracture.  The ankle was reduced and 2 K wires were placed, joint reduction was confirmed visually and with the C arm.  I then turned my attention to the lateral side where I made a lateral incision to get down to bone distally and then proximally blunt dissection was carried out to open up the peroneal tendon sheath and expose the proximal fibula  The fracture was comminuted posteriorly there was an oblique portion at the main portion of the fracture site after irrigation of the fracture site manual reduction was performed and held with first a pointed reduction clamp and then a threaded K wire.  X-ray confirmed  ankle mortise reduction.  A 10 hole one third tubular plate was then contoured to place along the fibula.  We started distally from the fracture and then placed a screw proximally from the fracture.  I attempted to place some of the screws into the tibia to gain extra fixation because the patient is diabetic  Serial screws were placed using AO technique  The distal screw was placed with a locking screw  Imaging studies were obtained throughout the case to confirm ankle mortise reduction  The wounds were then irrigated and closed with 2-0 Monocryl medially 0 Monocryl laterally staples on both sides and then field blocks were placed with Marcaine with epinephrine 30 cc on each side  Sterile dressings and sugar tong splint were applied  Tourniquet was released and the color of the foot was normal as was the capillary refill  She was extubated taken to recovery room in stable condition  Postop plan Toe-touch weightbearing on the right for 12 weeks No DVT prophylaxis needed after discharge Office visit in 2 weeks to remove staples and apply cast    PHYSICIAN ASSISTANT:   ASSISTANTS: none   ANESTHESIA:   general  EBL:  20 cc    BLOOD ADMINISTERED:none  DRAINS: none   LOCAL MEDICATIONS USED:  MARCAINE     SPECIMEN:  No Specimen  DISPOSITION OF SPECIMEN:  N/A  COUNTS:  YES  TOURNIQUET:   Total Tourniquet Time Documented: Thigh (Right) - 85 minutes Total: Thigh (Right) - 85 minutes  DICTATION: IT trainer Dictation  PLAN OF CARE: Admit to inpatient   PATIENT DISPOSITION:  PACU - hemodynamically stable.   Delay start of Pharmacological VTE agent (>24hrs) due to surgical blood loss or risk of bleeding: yes

## 2019-07-06 NOTE — Evaluation (Signed)
Physical Therapy Evaluation Patient Details Name: Nicole Sampson MRN: [id number] DOB: Apr 08, 1951 Today's Date: 07/06/2019   History of Present Illness  Nicole Sampson is a 68 y.o. female with medical history significant for diabetes mellitus, hypertension, endometrial cancer.  Patient presented to the ED today with reports of a fall.  Patient reports that she was walking when her leg slipped and she fell on her right lower extremity.  She has had persistent pain since then.  She denies any difficulty breathing, no chest pain, no dizziness.  No vomiting no loose stools she has maintained good p.o. intake.Patient did not lose consciousness, she was aware the whole time, she did not hit her head. S/P R ORIF 07/06/19  Clinical Impression  Patient s/p R ORIF 07/06/19 after falling at home. She presents with good bed mobilities and understands weight bearing limitations at this time. Patient able to take steps at bed side to bedside commode with min assist. Minor difficulties getting off of commode secondary to chair being low, min assist to get off commode. Patient would benefit from skilled physical therapy while in hospital and is safe to go home with brother if brother is able to assist with required assistance described below. Patient would also benefit from rolling walker and bed side commode to use at home.     Follow Up Recommendations Home health PT;Supervision for mobility/OOB    Equipment Recommendations  3in1 (PT);Rolling walker with 5" wheels    Recommendations for Other Services       Precautions / Restrictions Precautions Precautions: Fall Precaution Comments: toe touch weightbearing on R Required Braces or Orthoses: (soft case on right ankle/foot) Restrictions Weight Bearing Restrictions: Yes Other Position/Activity Restrictions: toe touch weight bearing      Mobility  Bed Mobility Overal bed mobility: Modified Independent             General bed mobility comments:  increased time  Transfers Overall transfer level: Needs assistance Equipment used: Rolling walker (2 wheeled) Transfers: Sit to/from [facility] Sit to Stand: Min guard Stand pivot transfers: Min guard       General transfer comment: verbal cues to move walker and to stay on toes of R LE  Ambulation/Gait Ambulation/Gait assistance: Min assist Gait Distance (Feet): 2 Feet(couple steps at bed side) Assistive device: Rolling walker (2 wheeled) Gait Pattern/deviations: Trunk flexed Gait velocity: decreased   General Gait Details: right leg toe touch weight bearing  Stairs            Wheelchair Mobility    Modified Rankin (Stroke Patients Only)       Balance                                             Pertinent Vitals/Pain Pain Assessment: No/denies pain    Home Living Family/patient expects to be discharged to:: Private residence Living Arrangements: Alone Available Help at Discharge: Family Type of Home: House Home Access: Stairs to enter Entrance Stairs-Rails: None Entrance Stairs-Number of Steps: 2 Home Layout: One level Home Equipment: Cane - single point      Prior Function Level of Independence: Independent               Hand Dominance        Extremity/Trunk Assessment                Communication  Communication: No difficulties  Cognition Arousal/Alertness: Awake/alert Behavior During Therapy: WFL for tasks assessed/performed Overall Cognitive Status: Within Functional Limits for tasks assessed                                        General Comments      Exercises Total Joint Exercises Ankle Circles/Pumps: AROM;Both;10 reps(right just toe flexion/extension) Long Arc Quad: AROM;Both;10 reps(R to tolerance)   Assessment/Plan    PT Assessment Patient needs continued PT services  PT Problem List Decreased strength;Decreased range of motion;Decreased activity  tolerance;Decreased balance;Pain;Decreased mobility       PT Treatment Interventions DME instruction;Gait training;Stair training;Functional mobility training;Therapeutic activities;Patient/family education;Therapeutic exercise;Balance training    PT Goals (Current goals can be found in the Care Plan section)  Acute Rehab PT Goals Patient Stated Goal: to go home PT Goal Formulation: With patient Time For Goal Achievement: 07/20/19 Potential to Achieve Goals: Good    Frequency 7X/week   Barriers to discharge        Co-evaluation               AM-PAC PT "6 Clicks" Mobility  Outcome Measure Help needed turning from your back to your side while in a flat bed without using bedrails?: None Help needed moving from lying on your back to sitting on the side of a flat bed without using bedrails?: None Help needed moving to and from a bed to a chair (including a wheelchair)?: A Little Help needed standing up from a chair using your arms (e.g., wheelchair or bedside chair)?: A Lot Help needed to walk in hospital room?: A Lot Help needed climbing 3-5 steps with a railing? : Total 6 Click Score: 16    End of Session Equipment Utilized During Treatment: Gait belt Activity Tolerance: Patient tolerated treatment well;Patient limited by fatigue Patient left: in bed;with call bell/phone within reach;with bed alarm set Nurse Communication: Mobility status;Other (comment)(need for different bed side commode) PT Visit Diagnosis: Unsteadiness on feet (R26.81);Other abnormalities of gait and mobility (R26.89);Muscle weakness (generalized) (M62.81);Difficulty in walking, not elsewhere classified (R26.2);History of falling (Z91.81)    Time: [id number] PT Time Calculation (min) (ACUTE ONLY): 30 min   Charges:   PT Evaluation $PT Eval Low Complexity: 1 Low PT Treatments $Therapeutic Activity: 8-22 mins      5:10 PM, 07/06/19 Jerene Pitch, DPT Physical Therapy with [facility]  [phone] office

## 2019-07-06 NOTE — Plan of Care (Signed)
  Problem: Acute Rehab PT Goals(only PT should resolve) Goal: Pt Will Transfer Bed To Chair/Chair To Bed Flowsheets (Taken 07/06/2019 1712) Pt will Transfer Bed to Chair/Chair to Bed:  with modified independence  Other (comment) Note: With RW and following toe touch weight bearing instructions Goal: Pt Will Ambulate Flowsheets (Taken 07/06/2019 1712) Pt will Ambulate:  15 feet  with minimal assist  with rolling walker Note: With toe touch weight bearing precautions observed   5:13 PM, 07/06/19 Jerene Pitch, DPT Physical Therapy with [facility]  [phone] office

## 2019-07-06 NOTE — Op Note (Addendum)
07/06/2019  1:09 PM  PATIENT:  Nicole Sampson  68 y.o. female  PRE-OPERATIVE DIAGNOSIS:  closed trimalleolar right ankle fracture  POST-OPERATIVE DIAGNOSIS:  closed trimalleolar right ankle fracture  PROCEDURE:  Procedure(s): OPEN REDUCTION INTERNAL FIXATION (ORIF) ANKLE FRACTURE (Right)  SURGEON:  Surgeon(s) and Role:    Carole Civil, MD - Primary  Implants Stryker ankle solutions 10 hole one third tubular plate laterally with multiple screws including 1 threaded K wire which was left in place on purpose and to medial K wires  In the 1 hole tubular plate the distal screw was a locking screw  Details of surgery  Ms. Kopp was brought to the operating room where she had general anesthesia she was placed on the operating table in the supine position with 4 blankets under the right leg and two 5 pound sandbag under the right hip  After sterile prep and drape the tourniquet was elevated after limb exsanguination with 4 inch Esmarch  We approached the medial side first  I made a curvilinear incision in standard fashion down to bone carried it distally used blunt dissection to expose the fracture site.  The joint was visible irrigated and found to have no bone in it but there was some soft tissue periosteum interposed.  The periosteum over the tibia was destroyed by the fracture.  The ankle was reduced and 2 K wires were placed, joint reduction was confirmed visually and with the C arm.  I then turned my attention to the lateral side where I made a lateral incision to get down to bone distally and then proximally blunt dissection was carried out to open up the peroneal tendon sheath and expose the proximal fibula  The fracture was comminuted posteriorly there was an oblique portion at the main portion of the fracture site after irrigation of the fracture site manual reduction was performed and held with first a pointed reduction clamp and then a threaded K wire.  X-ray confirmed  ankle mortise reduction.  A 10 hole one third tubular plate was then contoured to place along the fibula.  We started distally from the fracture and then placed a screw proximally from the fracture.  I attempted to place some of the screws into the tibia to gain extra fixation because the patient is diabetic  Serial screws were placed using AO technique  The distal screw was placed with a locking screw  Imaging studies were obtained throughout the case to confirm ankle mortise reduction.  The posterior malleolar fragment was less than 25% of the joint surface and reduced well with reduction of the medial lateral malleolus and no fixation was placed there.  The wounds were then irrigated and closed with 2-0 Monocryl medially 0 Monocryl laterally staples on both sides and then field blocks were placed with Marcaine with epinephrine 30 cc on each side  Sterile dressings and sugar tong splint were applied  Tourniquet was released and the color of the foot was normal as was the capillary refill  She was extubated taken to recovery room in stable condition  Postop plan Toe-touch weightbearing on the right for 12 weeks No DVT prophylaxis needed after discharge Office visit in 2 weeks to remove staples and apply cast    PHYSICIAN ASSISTANT:   ASSISTANTS: none   ANESTHESIA:   general  EBL:  20 cc    BLOOD ADMINISTERED:none  DRAINS: none   LOCAL MEDICATIONS USED:  MARCAINE     SPECIMEN:  No Specimen  DISPOSITION OF SPECIMEN:  N/A  COUNTS:  YES  TOURNIQUET:   Total Tourniquet Time Documented: Thigh (Right) - 85 minutes Total: Thigh (Right) - 85 minutes   DICTATION: .Viviann Spare Dictation  PLAN OF CARE: Admit to inpatient   PATIENT DISPOSITION:  PACU - hemodynamically stable.   Delay start of Pharmacological VTE agent (>24hrs) due to surgical blood loss or risk of bleeding: yes

## 2019-07-06 NOTE — Anesthesia Procedure Notes (Signed)
Procedure Name: Intubation Date/Time: 07/06/2019 11:04 AM Performed by: Lenice Llamas, MD Pre-anesthesia Checklist: Patient identified, Emergency Drugs available, Suction available, Patient being monitored and Timeout performed Patient Re-evaluated:Patient Re-evaluated prior to induction Oxygen Delivery Method: Circle system utilized Preoxygenation: Pre-oxygenation with 100% oxygen Induction Type: IV induction Laryngoscope Size: Mac and 4 Grade View: Grade I Tube type: Oral Tube size: 7.5 mm Number of attempts: 1 Airway Equipment and Method: Stylet Placement Confirmation: ETT inserted through vocal cords under direct vision,  positive ETCO2 and breath sounds checked- equal and bilateral Secured at: 21 cm Tube secured with: Tape Dental Injury: Teeth and Oropharynx as per pre-operative assessment

## 2019-07-06 NOTE — Transfer of Care (Signed)
Immediate Anesthesia Transfer of Care Note  Patient: Nicole Sampson  Procedure(s) Performed: OPEN REDUCTION INTERNAL FIXATION (ORIF) ANKLE FRACTURE (Right Ankle)  Patient Location: PACU  Anesthesia Type:General  Level of Consciousness: awake and oriented  Airway & Oxygen Therapy: Patient Spontanous Breathing and Patient connected to face mask oxygen  Post-op Assessment: Report given to RN and Post -op Vital signs reviewed and stable  Post vital signs: Reviewed and stable  Last Vitals:  Vitals Value Taken Time  BP 183/77 07/06/19 1315  Temp 37 C 07/06/19 1301  Pulse 97 07/06/19 1316  Resp 14 07/06/19 1316  SpO2 98 % 07/06/19 1316  Vitals shown include unvalidated device data.  Last Pain:  Vitals:   07/06/19 1307  TempSrc:   PainSc: 0-No pain         Complications: No apparent anesthesia complications

## 2019-07-07 LAB — BASIC METABOLIC PANEL
Anion gap: 10 (ref 5–15)
BUN: 39 mg/dL — ABNORMAL HIGH (ref 8–23)
CO2: 24 mmol/L (ref 22–32)
Calcium: 7.9 mg/dL — ABNORMAL LOW (ref 8.9–10.3)
Chloride: 103 mmol/L (ref 98–111)
Creatinine, Ser: 1.88 mg/dL — ABNORMAL HIGH (ref 0.44–1.00)
GFR calc Af Amer: 32 mL/min — ABNORMAL LOW (ref >60)
GFR calc non Af Amer: 27 mL/min — ABNORMAL LOW (ref >60)
Glucose, Bld: 164 mg/dL — ABNORMAL HIGH (ref 70–99)
Potassium: 4.4 mmol/L (ref 3.5–5.1)
Sodium: 137 mmol/L (ref 135–145)

## 2019-07-07 LAB — CBC
HCT: 31.9 % — ABNORMAL LOW (ref 36.0–46.0)
Hemoglobin: 9.9 g/dL — ABNORMAL LOW (ref 12.0–15.0)
MCH: 28.4 pg (ref 26.0–34.0)
MCHC: 31 g/dL (ref 30.0–36.0)
MCV: 91.4 fL (ref 80.0–100.0)
Platelets: 217 10*3/uL (ref 150–400)
RBC: 3.49 MIL/uL — ABNORMAL LOW (ref 3.87–5.11)
RDW: 14.6 % (ref 11.5–15.5)
WBC: 11 10*3/uL — ABNORMAL HIGH (ref 4.0–10.5)
nRBC: 0 % (ref 0.0–0.2)

## 2019-07-07 LAB — GLUCOSE, CAPILLARY
Glucose-Capillary: 145 mg/dL — ABNORMAL HIGH (ref 70–99)
Glucose-Capillary: 139 mg/dL — ABNORMAL HIGH (ref 70–99)
Glucose-Capillary: 176 mg/dL — ABNORMAL HIGH (ref 70–99)

## 2019-07-07 MED ORDER — ACETAMINOPHEN 325 MG PO TABS
650.0000 mg | ORAL_TABLET | Freq: Four times a day (QID) | ORAL | Status: DC
  Administered 2019-07-07 – 2019-07-08 (×6): 650 mg via ORAL
  Filled 2019-07-07 (×7): qty 2

## 2019-07-07 MED ORDER — ENOXAPARIN SODIUM 60 MG/0.6ML ~~LOC~~ SOLN
60.0000 mg | SUBCUTANEOUS | Status: DC
  Administered 2019-07-08: 60 mg via SUBCUTANEOUS
  Filled 2019-07-07: qty 0.6

## 2019-07-07 NOTE — Progress Notes (Signed)
Patient ID: [place], female   DOB: 02-10-1951, 68 y.o.   MRN: [id number] BP 119/62 (BP Location: Right Arm)   Pulse 70   Temp 98.3 F (36.8 C)   Resp 18   Ht 5\' 8"  (1.727 m)   Wt 126.9 kg   SpO2 98%   BMI 42.54 kg/m   POD 1 ORIF RIGHT ANKLE   Nicole Sampson feels good she had some trouble getting up which we expected.    Toes color is normal sensory is normal capillary refill is normal   Plan: Expect home with home health tomorrow

## 2019-07-07 NOTE — Progress Notes (Signed)
PROGRESS NOTE Nicole Sampson   Nicole Sampson  [id number]  DOB: [birth year]  DOA: 07/05/2019 PCP: Berenice Primas   Brief Admission Hx: 68 y.o. female with medical history significant for diabetes mellitus, hypertension, endometrial cancer.  Patient reports that she was walking when her leg slipped and she fell on her right lower extremity.  She has had persistent pain since then.  MDM/Assessment & Plan:   1. Trimalleolar right ankle fracture - POD#1 s/p ORIF of right ankle fracture.  Pain controlled.  Agreeable to [facility].  2. Essential hypertension - presented with soft pressures, temporarily holding HCTZ and losartan.  3. Type 2 DM - continue SSI coverage and CBG testing.  4. Endometrial cancer - s/p recent robotic hysterectomy and salpingo-oophorectomy 06/15/19.   DVT prophylaxis: SCDs Code Status: Full  Family Communication: patient updated  Disposition Plan: home with [facility] 12/26    Consultants:  Orthopedics   Procedures:  ORIF right ankle 07/06/19  Antimicrobials:     Subjective: Pt reports pain is controlled.  Agreeable to [facility].   Objective: Vitals:   07/07/19 0013 07/07/19 0400 07/07/19 0800 07/07/19 1200  BP: (!) 144/69 119/62 125/60 (!) 130/59  Pulse: 92 70    Resp: 18 18 18 18   Temp: 98.1 F (36.7 C) 98.3 F (36.8 C) 98.2 F (36.8 C) 98 F (36.7 C)  TempSrc: Oral  Oral Oral  SpO2: 97% 98% 97% 98%  Weight:      Height:        Intake/Output Summary (Last 24 hours) at 07/07/2019 1543 Last data filed at 07/07/2019 0654 Gross per 24 hour  Intake 982.94 ml  Output 300 ml  Net 682.94 ml   Filed Weights   07/05/19 1355 07/06/19 1425  Weight: 122.5 kg 126.9 kg   REVIEW OF SYSTEMS  As per history otherwise all reviewed and reported negative  Exam:  General exam: sitting up in bed, awake, alert, NAD, cooperative.  Respiratory system: Clear. No increased work of breathing. Cardiovascular system: S1 & S2 heard. No JVD, murmurs, gallops,  clicks or pedal edema. Gastrointestinal system: Abdomen is nondistended, soft and nontender. Normal bowel sounds heard. Central nervous system: Alert and oriented. No focal neurological deficits. Extremities: right ankle casted. Good pulses bilateral LEs.   Data Reviewed: Basic Metabolic Panel: Recent Labs  Lab 07/05/19 1540 07/06/19 1455 07/07/19 0435  NA 136  --  137  K 4.1  --  4.4  CL 102  --  103  CO2 23  --  24  GLUCOSE 149*  --  164*  BUN 34*  --  39*  CREATININE 1.39* 1.55* 1.88*  CALCIUM 8.9  --  7.9*   Liver Function Tests: No results for input(s): AST, ALT, ALKPHOS, BILITOT, PROT, ALBUMIN in the last 168 hours. No results for input(s): LIPASE, AMYLASE in the last 168 hours. No results for input(s): AMMONIA in the last 168 hours. CBC: Recent Labs  Lab 07/05/19 1540 07/06/19 1455 07/07/19 0435  WBC 10.1 10.1 11.0*  HGB 11.2* 10.6* 9.9*  HCT 35.6* 33.9* 31.9*  MCV 89.7 91.1 91.4  PLT 229 207 217   Cardiac Enzymes: No results for input(s): CKTOTAL, CKMB, CKMBINDEX, TROPONINI in the last 168 hours. CBG (last 3)  Recent Labs    07/06/19 1303 07/06/19 1619 07/07/19 0401  GLUCAP 152* 214* 145*   Recent Results (from the past 240 hour(s))  Respiratory Panel by RT PCR (Flu A&B, Covid) - Nasopharyngeal Swab     Status: None  Collection Time: 07/05/19  8:36 PM   Specimen: Nasopharyngeal Swab  Result Value Ref Range Status   SARS Coronavirus 2 by RT PCR NEGATIVE NEGATIVE Final    Comment: (NOTE) SARS-CoV-2 target nucleic acids are NOT DETECTED. The SARS-CoV-2 RNA is generally detectable in upper respiratoy specimens during the acute phase of infection. The lowest concentration of SARS-CoV-2 viral copies this assay can detect is 131 copies/mL. A negative result does not preclude SARS-Cov-2 infection and should not be used as the sole basis for treatment or other patient management decisions. A negative result may occur with  improper specimen  collection/handling, submission of specimen other than nasopharyngeal swab, presence of viral mutation(s) within the areas targeted by this assay, and inadequate number of viral copies (<131 copies/mL). A negative result must be combined with clinical observations, patient history, and epidemiological information. The expected result is Negative. Fact Sheet for Patients:  PinkCheek.be Fact Sheet for Healthcare Providers:  GravelBags.it This test is not yet ap proved or cleared by the [place] FDA and  has been authorized for detection and/or diagnosis of SARS-CoV-2 by FDA under an Emergency Use Authorization (EUA). This EUA will remain  in effect (meaning this test can be used) for the duration of the COVID-19 declaration under Section 564(b)(1) of the Act, 21 U.S.C. section 360bbb-3(b)(1), unless the authorization is terminated or revoked sooner.    Influenza A by PCR NEGATIVE NEGATIVE Final   Influenza B by PCR NEGATIVE NEGATIVE Final    Comment: (NOTE) The Xpert Xpress SARS-CoV-2/FLU/RSV assay is intended as an aid in  the diagnosis of influenza from Nasopharyngeal swab specimens and  should not be used as a sole basis for treatment. Nasal washings and  aspirates are unacceptable for Xpert Xpress SARS-CoV-2/FLU/RSV  testing. Fact Sheet for Patients: PinkCheek.be Fact Sheet for Healthcare Providers: GravelBags.it This test is not yet approved or cleared by the [place] FDA and  has been authorized for detection and/or diagnosis of SARS-CoV-2 by  FDA under an Emergency Use Authorization (EUA). This EUA will remain  in effect (meaning this test can be used) for the duration of the  Covid-19 declaration under Section 564(b)(1) of the Act, 21  U.S.C. section 360bbb-3(b)(1), unless the authorization is  terminated or revoked. Performed at [facility],  [address] [place], [place] [address]   Surgical PCR screen     Status: None   Collection Time: 07/06/19  5:19 AM   Specimen: Nasal Mucosa; Nasal Swab  Result Value Ref Range Status   MRSA, PCR NEGATIVE NEGATIVE Final   Staphylococcus aureus NEGATIVE NEGATIVE Final    Comment: (NOTE) The Xpert SA Assay (FDA approved for NASAL specimens in patients 58 years of age and older), is one component of a comprehensive surveillance program. It is not intended to diagnose infection nor to guide or monitor treatment. Performed at [facility], [address], [place], [place] [address]      Studies: DG Ankle 2 Views Right  Result Date: 07/06/2019 CLINICAL DATA:  Fixation of stage IV Weber B fracture. EXAM: DG C-ARM 1-60 MIN; RIGHT ANKLE - 2 VIEW COMPARISON:  Radiographs from 07/05/2019 FINDINGS: A series of 9 fluoroscopic spot images demonstrates ORIF of the prior fracture with K-fixation of the medial malleolar fragment, and lateral plate and screw fixation of the distal fibula fracture. There is reduced displacement of the posterior malleolar fracture fragment compared to the initial diagnostic exam of 07/05/2019. A long transverse screw extends from the lateral malleolar plate into  the distal tibial metaphysis. IMPRESSION: 1. ORIF of the stage IV Weber B fracture. No complicating features are identified. Electronically Signed   By: Van Clines M.D.   On: 07/06/2019 13:18   DG Pelvis Portable  Result Date: 07/05/2019 CLINICAL DATA:  68 year old female with fall and right hip pain. EXAM: PORTABLE PELVIS 1-2 VIEWS COMPARISON:  None. FINDINGS: Evaluation is limited due to body habitus and soft tissue attenuation. No definite acute fracture identified. There is no dislocation. No significant arthritic changes. There is degenerative changes of the lower lumbar spine. The soft tissues are grossly unremarkable. IMPRESSION: No definite acute fracture or dislocation. Electronically Signed   By: Anner Crete M.D.   On: 07/05/2019 22:25   DG C-Arm 1-60 Min  Result Date: 07/06/2019 CLINICAL DATA:  Fixation of stage IV Weber B fracture. EXAM: DG C-ARM 1-60 MIN; RIGHT ANKLE - 2 VIEW COMPARISON:  Radiographs from 07/05/2019 FINDINGS: A series of 9 fluoroscopic spot images demonstrates ORIF of the prior fracture with K-fixation of the medial malleolar fragment, and lateral plate and screw fixation of the distal fibula fracture. There is reduced displacement of the posterior malleolar fracture fragment compared to the initial diagnostic exam of 07/05/2019. A long transverse screw extends from the lateral malleolar plate into the distal tibial metaphysis. IMPRESSION: 1. ORIF of the stage IV Weber B fracture. No complicating features are identified. Electronically Signed   By: Van Clines M.D.   On: 07/06/2019 13:18   Scheduled Meds: . acetaminophen  650 mg Oral Q6H  . docusate sodium  100 mg Oral BID  . [START ON 07/08/2019] enoxaparin (LOVENOX) injection  60 mg Subcutaneous Q24H  . mupirocin ointment  1 application Nasal BID   Continuous Infusions: . sodium chloride 70 mL/hr at 07/07/19 1214    Active Problems:   Closed right ankle fracture   Closed trimalleolar fracture of right ankle   Time spent:   Irwin Brakeman, MD [facility] 07/07/2019, 3:43 PM    LOS: 2 days  How to contact the [facility] Attending or Consulting provider [place] or covering provider during after hours [place], for this patient?  1. Check the care team in [facility] and look for a) attending/consulting [facility] provider listed and b) the [facility] team listed 2. Log into [email or web address] and use [place]'s universal password to access. If you do not have the password, please contact the hospital operator. 3. Locate the [facility] provider you are looking for under [facility] and page to a number that you can be directly reached. 4. If you still have difficulty reaching the provider, please page the [facility] (Director on  Call) for the Hospitalists listed on amion for assistance.

## 2019-07-07 NOTE — Plan of Care (Signed)
  Problem: Education: Goal: Knowledge of General Education information will improve Description Including pain rating scale, medication(s)/side effects and non-pharmacologic comfort measures Outcome: Progressing   Problem: Health Behavior/Discharge Planning: Goal: Ability to manage health-related needs will improve Outcome: Progressing   

## 2019-07-08 ENCOUNTER — Encounter (HOSPITAL_COMMUNITY): Payer: Self-pay | Admitting: Internal Medicine

## 2019-07-08 DIAGNOSIS — S82891D Other fracture of right lower leg, subsequent encounter for closed fracture with routine healing: Secondary | ICD-10-CM

## 2019-07-08 LAB — BASIC METABOLIC PANEL
Anion gap: 7 (ref 5–15)
BUN: 39 mg/dL — ABNORMAL HIGH (ref 8–23)
CO2: 24 mmol/L (ref 22–32)
Calcium: 7.4 mg/dL — ABNORMAL LOW (ref 8.9–10.3)
Chloride: 104 mmol/L (ref 98–111)
Creatinine, Ser: 1.54 mg/dL — ABNORMAL HIGH (ref 0.44–1.00)
GFR calc Af Amer: 40 mL/min — ABNORMAL LOW (ref >60)
GFR calc non Af Amer: 35 mL/min — ABNORMAL LOW (ref >60)
Glucose, Bld: 130 mg/dL — ABNORMAL HIGH (ref 70–99)
Potassium: 4.2 mmol/L (ref 3.5–5.1)
Sodium: 135 mmol/L (ref 135–145)

## 2019-07-08 MED ORDER — ACETAMINOPHEN 325 MG PO TABS: 650.0000 mg | ORAL_TABLET | Freq: Four times a day (QID) | ORAL | Status: DC | PRN

## 2019-07-08 MED ORDER — DOCUSATE SODIUM 100 MG PO CAPS: 100.0000 mg | ORAL_CAPSULE | Freq: Two times a day (BID) | ORAL | 0 refills | Status: DC | PRN

## 2019-07-08 NOTE — Progress Notes (Signed)
Patient ID: Nicole Sampson, female   DOB: 08/30/1950, 68 y.o.   MRN: [id number] POD 2   Right ankleif   Ok for discharge when PT says ok

## 2019-07-08 NOTE — Discharge Summary (Addendum)
Physician Discharge Summary  Nicole Sampson [id number] DOB: 09-27-1950 DOA: 07/05/2019  PCP: Berenice Primas Orthopedics: Aline Brochure  Admit date: 07/05/2019 Discharge date: 07/08/2019  Admitted From:  Home  Disposition: Home with Home Health   Recommendations for Outpatient Follow-up:  1. Follow up with Dr. Aline Brochure in 2 weeks for suture removal and casting  Home Health:  PT, rolling walker, wheelchair, 3 in 1  Discharge Condition: STABLE   CODE STATUS: FULL    Brief Hospitalization Summary: Please see all hospital notes, images, labs for full details of the hospitalization. ADMISSION HPI: Nicole Sampson is a 68 y.o. female with medical history significant for diabetes mellitus, hypertension, endometrial cancer.  Patient presented to the ED today with reports of a fall.  Patient reports that she was walking when her leg slipped and she fell on her right lower extremity.  She has had persistent pain since then.  She denies any difficulty breathing, no chest pain, no dizziness.  No vomiting no loose stools she has maintained good p.o. intake. Patient did not lose consciousness, she was aware the whole time, she did not hit her head.  ED Course: Stable vitals.  Unremarkable CBC, BMP.  Ankle x-ray shows trimalleolar fracture dislocation of the ankle.  Port Chest x-ray negative acute abnormality. EDP talked to Dr. Aline Brochure, admit here, will see patient in the morning.  Brief Admission Hx: 68 y.o.femalewith medical history significant fordiabetes mellitus, hypertension, endometrial cancer.  Patient reports that she was walking when her leg slipped and she fell on her right lower extremity. She has had persistent pain since then.  MDM/Assessment & Plan:   1. Trimalleolar right ankle fracture - POD#2 s/p ORIF of right ankle fracture.  Pain controlled with tylenol.  Agreeable to [facility] which is arranged.  2. Essential hypertension - resume HCTZ, holding losartan due to renal  insufficiency, follow up with PCP.   3. Type 2 DM - continue SSI coverage and CBG testing.  4. Endometrial cancer - s/p recent robotic hysterectomy and salpingo-oophorectomy 06/15/19.  Follow up with Dr. Everitt Amber.   DVT prophylaxis: SCDs Code Status: Full  Family Communication: patient updated  Disposition Plan: home with [facility] 12/26   Consultants:  Orthopedics   Procedures:  ORIF right ankle 07/06/19 Discharge Diagnoses:  Active Problems:   Closed right ankle fracture   Closed trimalleolar fracture of right ankle   Discharge Instructions:  Allergies as of 07/08/2019      Reactions   Other Swelling, Other (See Comments)   "lime jello causes swelling"      Medication List    STOP taking these medications   ibuprofen 800 MG tablet Commonly known as: ADVIL   losartan 50 MG tablet Commonly known as: COZAAR   senna-docusate 8.6-50 MG tablet Commonly known as: Senokot-S   traMADol 50 MG tablet Commonly known as: ULTRAM     TAKE these medications   acetaminophen 325 MG tablet Commonly known as: TYLENOL Take 2 tablets (650 mg total) by mouth every 6 (six) hours as needed for mild pain or moderate pain.   ascorbic acid 500 MG tablet Commonly known as: VITAMIN C Take 500 mg by mouth daily.   aspirin EC 81 MG tablet Take 81 mg by mouth every other day.   CALCIUM + D PO Take 1 tablet by mouth daily.   docusate sodium 100 MG capsule Commonly known as: COLACE Take 1 capsule (100 mg total) by mouth 2 (two) times daily as needed for mild constipation.  hydrochlorothiazide 12.5 MG capsule Commonly known as: MICROZIDE Take 12.5 mg by mouth daily.   metFORMIN 500 MG tablet Commonly known as: GLUCOPHAGE Take 500 mg by mouth daily with breakfast.   multivitamin tablet Take 1 tablet by mouth daily.   Potassium 99 MG Tabs Take 1 tablet by mouth daily as needed (for cramps).            Durable Medical Equipment  (From admission, onward)          Start     Ordered   07/08/19 1025  DME Wheelchair manual  Once    Comments: Patient suffers from fractured ankle  which impairs their ability to perform daily activities like dressing, feeding, grooming and toileting in the home.  A walker will not resolve  issue with performing activities of daily living. A wheelchair will allow patient to safely perform daily activities. Patient can safely propel the wheelchair in the home or has a caregiver who can provide assistance.  Accessories: elevating leg rests (ELRs), wheel locks, extensions and anti-tippers.   07/08/19 1024   07/07/19 1017  For home use only DME 3 n 1  Once     07/07/19 1016   07/07/19 1017  For home use only DME Walker rolling  Once    Question Answer Comment  Patient needs a walker to treat with the following condition Gait instability   Patient needs a walker to treat with the following condition Closed right ankle fracture      07/07/19 1016         Follow-up Information    Carole Civil, MD. Schedule an appointment as soon as possible for a visit in 2 week(s).   Specialties: Orthopedic Surgery, Radiology Why: [place] Robert and apply Cast  Contact information: [address] [place] [address] [phone]        Berenice Primas. Schedule an appointment as soon as possible for a visit in 1 week(s).   Specialty: Nurse Practitioner Why: recheck BMP and blood pressure  Contact information: [place] [address] [phone]          Allergies  Allergen Reactions  . Other Swelling and Other (See Comments)    "lime jello causes swelling"   Allergies as of 07/08/2019      Reactions   Other Swelling, Other (See Comments)   "lime jello causes swelling"      Medication List    STOP taking these medications   ibuprofen 800 MG tablet Commonly known as: ADVIL   losartan 50 MG tablet Commonly known as: COZAAR   senna-docusate 8.6-50 MG tablet Commonly known as:  Senokot-S   traMADol 50 MG tablet Commonly known as: ULTRAM     TAKE these medications   acetaminophen 325 MG tablet Commonly known as: TYLENOL Take 2 tablets (650 mg total) by mouth every 6 (six) hours as needed for mild pain or moderate pain.   ascorbic acid 500 MG tablet Commonly known as: VITAMIN C Take 500 mg by mouth daily.   aspirin EC 81 MG tablet Take 81 mg by mouth every other day.   CALCIUM + D PO Take 1 tablet by mouth daily.   docusate sodium 100 MG capsule Commonly known as: COLACE Take 1 capsule (100 mg total) by mouth 2 (two) times daily as needed for mild constipation.   hydrochlorothiazide 12.5 MG capsule Commonly known as: MICROZIDE Take 12.5 mg by mouth daily.   metFORMIN 500 MG tablet Commonly known  as: GLUCOPHAGE Take 500 mg by mouth daily with breakfast.   multivitamin tablet Take 1 tablet by mouth daily.   Potassium 99 MG Tabs Take 1 tablet by mouth daily as needed (for cramps).            Durable Medical Equipment  (From admission, onward)         Start     Ordered   07/08/19 1025  DME Wheelchair manual  Once    Comments: Patient suffers from fractured ankle  which impairs their ability to perform daily activities like dressing, feeding, grooming and toileting in the home.  A walker will not resolve  issue with performing activities of daily living. A wheelchair will allow patient to safely perform daily activities. Patient can safely propel the wheelchair in the home or has a caregiver who can provide assistance.  Accessories: elevating leg rests (ELRs), wheel locks, extensions and anti-tippers.   07/08/19 1024   07/07/19 1017  For home use only DME 3 n 1  Once     07/07/19 1016   07/07/19 1017  For home use only DME Walker rolling  Once    Question Answer Comment  Patient needs a walker to treat with the following condition Gait instability   Patient needs a walker to treat with the following condition Closed right ankle fracture       07/07/19 1016          Procedures/Studies: DG Ankle 2 Views Right  Result Date: 07/06/2019 CLINICAL DATA:  Fixation of stage IV Weber B fracture. EXAM: DG C-ARM 1-60 MIN; RIGHT ANKLE - 2 VIEW COMPARISON:  Radiographs from 07/05/2019 FINDINGS: A series of 9 fluoroscopic spot images demonstrates ORIF of the prior fracture with K-fixation of the medial malleolar fragment, and lateral plate and screw fixation of the distal fibula fracture. There is reduced displacement of the posterior malleolar fracture fragment compared to the initial diagnostic exam of 07/05/2019. A long transverse screw extends from the lateral malleolar plate into the distal tibial metaphysis. IMPRESSION: 1. ORIF of the stage IV Weber B fracture. No complicating features are identified. Electronically Signed   By: Van Clines M.D.   On: 07/06/2019 13:18   DG Ankle Complete Right  Result Date: 07/05/2019 CLINICAL DATA:  Post reduction right ankle. EXAM: RIGHT ANKLE - COMPLETE 3+ VIEW COMPARISON:  Prior study same day. FINDINGS: Patient is casted. Displaced fractures of the distal right fibula, medial malleolus, posterior malleolus noted. Tibiotalar joint is disrupted with lateral subluxation of the talus again noted. IMPRESSION: Patient is casted. Displaced trimalleolar fractures with disruption of the tibiotalar joint and lateral subluxation of the talus again noted. Similar findings on prior exam. Electronically Signed   By: Marcello Moores  Register   On: 07/05/2019 15:05   DG Ankle Complete Right  Result Date: 07/05/2019 CLINICAL DATA:  Fall today EXAM: RIGHT ANKLE - COMPLETE 3+ VIEW COMPARISON:  None. FINDINGS: Trimalleolar ankle fracture. Lateral subluxation of the talus. Fracture distal fibula, medial malleolus, and posterior malleolus. IMPRESSION: Trimalleolar fracture dislocation of the ankle. Electronically Signed   By: Franchot Gallo M.D.   On: 07/05/2019 14:19   DG Pelvis Portable  Result Date:  07/05/2019 CLINICAL DATA:  68 year old female with fall and right hip pain. EXAM: PORTABLE PELVIS 1-2 VIEWS COMPARISON:  None. FINDINGS: Evaluation is limited due to body habitus and soft tissue attenuation. No definite acute fracture identified. There is no dislocation. No significant arthritic changes. There is degenerative changes of the lower lumbar spine.  The soft tissues are grossly unremarkable. IMPRESSION: No definite acute fracture or dislocation. Electronically Signed   By: Anner Crete M.D.   On: 07/05/2019 22:25   DG Chest Port 1 View  Result Date: 07/05/2019 CLINICAL DATA:  Preoperative exam. EXAM: PORTABLE CHEST 1 VIEW COMPARISON:  No prior. FINDINGS: Mediastinum and hilar structures are normal. Cardiomegaly. No pulmonary venous congestion. No focal infiltrate. No pleural effusion or pneumothorax. Degenerative change thoracic spine. IMPRESSION: Cardiomegaly.  No pulmonary venous congestion.  No acute infiltrate. Electronically Signed   By: Marcello Moores  Register   On: 07/05/2019 15:56   DG C-Arm 1-60 Min  Result Date: 07/06/2019 CLINICAL DATA:  Fixation of stage IV Weber B fracture. EXAM: DG C-ARM 1-60 MIN; RIGHT ANKLE - 2 VIEW COMPARISON:  Radiographs from 07/05/2019 FINDINGS: A series of 9 fluoroscopic spot images demonstrates ORIF of the prior fracture with K-fixation of the medial malleolar fragment, and lateral plate and screw fixation of the distal fibula fracture. There is reduced displacement of the posterior malleolar fracture fragment compared to the initial diagnostic exam of 07/05/2019. A long transverse screw extends from the lateral malleolar plate into the distal tibial metaphysis. IMPRESSION: 1. ORIF of the stage IV Weber B fracture. No complicating features are identified. Electronically Signed   By: Van Clines M.D.   On: 07/06/2019 13:18     Subjective: Pt says she feels well, tylenol has been controlling the pain, she is working with PT and agreeable to home  health PT.    Discharge Exam: Vitals:   07/07/19 2209 07/08/19 0523  BP: 132/65 (!) 142/75  Pulse: 78 78  Resp: 18 18  Temp: 97.8 F (36.6 C) 98.2 F (36.8 C)  SpO2: 95% 99%   Vitals:   07/07/19 1600 07/07/19 2021 07/07/19 2209 07/08/19 0523  BP: 132/60  132/65 (!) 142/75  Pulse:   78 78  Resp: 18  18 18   Temp: 98.2 F (36.8 C)  97.8 F (36.6 C) 98.2 F (36.8 C)  TempSrc: Oral     SpO2: 98% 97% 95% 99%  Weight:      Height:       General exam: sitting up in bed, awake, alert, NAD, cooperative.  Respiratory system: Clear. No increased work of breathing. Cardiovascular system: S1 & S2 heard. No JVD, murmurs, gallops, clicks or pedal edema. Gastrointestinal system: Abdomen is nondistended, soft and nontender. Normal bowel sounds heard. Central nervous system: Alert and oriented. No focal neurological deficits. Extremities: right ankle casted. Good pulses bilateral LEs.    The results of significant diagnostics from this hospitalization (including imaging, microbiology, ancillary and laboratory) are listed below for reference.     Microbiology: Recent Results (from the past 240 hour(s))  Respiratory Panel by RT PCR (Flu A&B, Covid) - Nasopharyngeal Swab     Status: None   Collection Time: 07/05/19  8:36 PM   Specimen: Nasopharyngeal Swab  Result Value Ref Range Status   SARS Coronavirus 2 by RT PCR NEGATIVE NEGATIVE Final    Comment: (NOTE) SARS-CoV-2 target nucleic acids are NOT DETECTED. The SARS-CoV-2 RNA is generally detectable in upper respiratoy specimens during the acute phase of infection. The lowest concentration of SARS-CoV-2 viral copies this assay can detect is 131 copies/mL. A negative result does not preclude SARS-Cov-2 infection and should not be used as the sole basis for treatment or other patient management decisions. A negative result may occur with  improper specimen collection/handling, submission of specimen other than nasopharyngeal swab,  presence of  viral mutation(s) within the areas targeted by this assay, and inadequate number of viral copies (<131 copies/mL). A negative result must be combined with clinical observations, patient history, and epidemiological information. The expected result is Negative. Fact Sheet for Patients:  PinkCheek.be Fact Sheet for Healthcare Providers:  GravelBags.it This test is not yet ap proved or cleared by the [place] FDA and  has been authorized for detection and/or diagnosis of SARS-CoV-2 by FDA under an Emergency Use Authorization (EUA). This EUA will remain  in effect (meaning this test can be used) for the duration of the COVID-19 declaration under Section 564(b)(1) of the Act, 21 U.S.C. section 360bbb-3(b)(1), unless the authorization is terminated or revoked sooner.    Influenza A by PCR NEGATIVE NEGATIVE Final   Influenza B by PCR NEGATIVE NEGATIVE Final    Comment: (NOTE) The Xpert Xpress SARS-CoV-2/FLU/RSV assay is intended as an aid in  the diagnosis of influenza from Nasopharyngeal swab specimens and  should not be used as a sole basis for treatment. Nasal washings and  aspirates are unacceptable for Xpert Xpress SARS-CoV-2/FLU/RSV  testing. Fact Sheet for Patients: PinkCheek.be Fact Sheet for Healthcare Providers: GravelBags.it This test is not yet approved or cleared by the [place] FDA and  has been authorized for detection and/or diagnosis of SARS-CoV-2 by  FDA under an Emergency Use Authorization (EUA). This EUA will remain  in effect (meaning this test can be used) for the duration of the  Covid-19 declaration under Section 564(b)(1) of the Act, 21  U.S.C. section 360bbb-3(b)(1), unless the authorization is  terminated or revoked. Performed at [facility], [address] [place], [place] [address]   Surgical PCR screen     Status: None    Collection Time: 07/06/19  5:19 AM   Specimen: Nasal Mucosa; Nasal Swab  Result Value Ref Range Status   MRSA, PCR NEGATIVE NEGATIVE Final   Staphylococcus aureus NEGATIVE NEGATIVE Final    Comment: (NOTE) The Xpert SA Assay (FDA approved for NASAL specimens in patients 45 years of age and older), is one component of a comprehensive surveillance program. It is not intended to diagnose infection nor to guide or monitor treatment. Performed at [facility], [address] [place],  [address]      Labs: BNP (last 3 results) No results for input(s): BNP in the last 8760 hours. Basic Metabolic Panel: Recent Labs  Lab 07/05/19 1540 07/06/19 1455 07/07/19 0435 07/08/19 0555  NA 136  --  137 135  K 4.1  --  4.4 4.2  CL 102  --  103 104  CO2 23  --  24 24  GLUCOSE 149*  --  164* 130*  BUN 34*  --  39* 39*  CREATININE 1.39* 1.55* 1.88* 1.54*  CALCIUM 8.9  --  7.9* 7.4*   Liver Function Tests: No results for input(s): AST, ALT, ALKPHOS, BILITOT, PROT, ALBUMIN in the last 168 hours. No results for input(s): LIPASE, AMYLASE in the last 168 hours. No results for input(s): AMMONIA in the last 168 hours. CBC: Recent Labs  Lab 07/05/19 1540 07/06/19 1455 07/07/19 0435  WBC 10.1 10.1 11.0*  HGB 11.2* 10.6* 9.9*  HCT 35.6* 33.9* 31.9*  MCV 89.7 91.1 91.4  PLT 229 207 217   Cardiac Enzymes: No results for input(s): CKTOTAL, CKMB, CKMBINDEX, TROPONINI in the last 168 hours. BNP: Invalid input(s): POCBNP CBG: Recent Labs  Lab 07/06/19 1303 07/06/19 1619 07/07/19 0401 07/07/19 1627 07/07/19 2019  GLUCAP 152* 214* 145*  139* 176*   D-Dimer No results for input(s): DDIMER in the last 72 hours. Hgb A1c No results for input(s): HGBA1C in the last 72 hours. Lipid Profile No results for input(s): CHOL, HDL, LDLCALC, TRIG, CHOLHDL, LDLDIRECT in the last 72 hours. Thyroid function studies No results for input(s): TSH, T4TOTAL, T3FREE, THYROIDAB in the last 72  hours.  Invalid input(s): FREET3 Anemia work up No results for input(s): VITAMINB12, FOLATE, FERRITIN, TIBC, IRON, RETICCTPCT in the last 72 hours. Urinalysis    Component Value Date/Time   COLORURINE YELLOW 06/14/2019 1049   APPEARANCEUR CLEAR 06/14/2019 1049   LABSPEC 1.009 06/14/2019 1049   PHURINE 5.0 06/14/2019 1049   GLUCOSEU NEGATIVE 06/14/2019 1049   HGBUR MODERATE (A) 06/14/2019 1049   BILIRUBINUR NEGATIVE 06/14/2019 1049   KETONESUR NEGATIVE 06/14/2019 1049   PROTEINUR NEGATIVE 06/14/2019 1049   NITRITE NEGATIVE 06/14/2019 1049   LEUKOCYTESUR LARGE (A) 06/14/2019 1049   Sepsis Labs Invalid input(s): PROCALCITONIN,  WBC,  LACTICIDVEN Microbiology Recent Results (from the past 240 hour(s))  Respiratory Panel by RT PCR (Flu A&B, Covid) - Nasopharyngeal Swab     Status: None   Collection Time: 07/05/19  8:36 PM   Specimen: Nasopharyngeal Swab  Result Value Ref Range Status   SARS Coronavirus 2 by RT PCR NEGATIVE NEGATIVE Final    Comment: (NOTE) SARS-CoV-2 target nucleic acids are NOT DETECTED. The SARS-CoV-2 RNA is generally detectable in upper respiratoy specimens during the acute phase of infection. The lowest concentration of SARS-CoV-2 viral copies this assay can detect is 131 copies/mL. A negative result does not preclude SARS-Cov-2 infection and should not be used as the sole basis for treatment or other patient management decisions. A negative result may occur with  improper specimen collection/handling, submission of specimen other than nasopharyngeal swab, presence of viral mutation(s) within the areas targeted by this assay, and inadequate number of viral copies (<131 copies/mL). A negative result must be combined with clinical observations, patient history, and epidemiological information. The expected result is Negative. Fact Sheet for Patients:  PinkCheek.be Fact Sheet for Healthcare Providers:   GravelBags.it This test is not yet ap proved or cleared by the [place] FDA and  has been authorized for detection and/or diagnosis of SARS-CoV-2 by FDA under an Emergency Use Authorization (EUA). This EUA will remain  in effect (meaning this test can be used) for the duration of the COVID-19 declaration under Section 564(b)(1) of the Act, 21 U.S.C. section 360bbb-3(b)(1), unless the authorization is terminated or revoked sooner.    Influenza A by PCR NEGATIVE NEGATIVE Final   Influenza B by PCR NEGATIVE NEGATIVE Final    Comment: (NOTE) The Xpert Xpress SARS-CoV-2/FLU/RSV assay is intended as an aid in  the diagnosis of influenza from Nasopharyngeal swab specimens and  should not be used as a sole basis for treatment. Nasal washings and  aspirates are unacceptable for Xpert Xpress SARS-CoV-2/FLU/RSV  testing. Fact Sheet for Patients: PinkCheek.be Fact Sheet for Healthcare Providers: GravelBags.it This test is not yet approved or cleared by the [place] FDA and  has been authorized for detection and/or diagnosis of SARS-CoV-2 by  FDA under an Emergency Use Authorization (EUA). This EUA will remain  in effect (meaning this test can be used) for the duration of the  Covid-19 declaration under Section 564(b)(1) of the Act, 21  U.S.C. section 360bbb-3(b)(1), unless the authorization is  terminated or revoked. Performed at [facility], [address], [place], [place] [address]   Surgical PCR screen  Status: None   Collection Time: 07/06/19  5:19 AM   Specimen: Nasal Mucosa; Nasal Swab  Result Value Ref Range Status   MRSA, PCR NEGATIVE NEGATIVE Final   Staphylococcus aureus NEGATIVE NEGATIVE Final    Comment: (NOTE) The Xpert SA Assay (FDA approved for NASAL specimens in patients 4 years of age and older), is one component of a comprehensive surveillance program. It is not intended  to diagnose infection nor to guide or monitor treatment. Performed at [facility], [address] [place], [place] [address]    Time coordinating discharge:  33 minutes   SIGNED:  Irwin Brakeman, MD  [facility] 07/08/2019, 11:28 AM How to contact the [facility] Attending or Consulting provider [place] or covering provider during after hours [place], for this patient?  1. Check the care team in [facility] and look for a) attending/consulting TRH provider listed and b) the [facility] team listed 2. Log into [email or web address] and use [place]'s universal password to access. If you do not have the password, please contact the hospital operator. 3. Locate the [facility] provider you are looking for under [facility] and page to a number that you can be directly reached. 4. If you still have difficulty reaching the provider, please page the [facility] (Director on Call) for the Hospitalists listed on amion for assistance.

## 2019-07-08 NOTE — TOC Transition Note (Signed)
Transition of Care [facility]) - CM/SW Discharge Note   Patient Details  Name: Nicole Sampson MRN: [id number] Date of Birth: Apr 29, 1951  Transition of Care [facility]) CM/SW Contact:  Marshell Garfinkel, RN Phone Number: 07/08/2019, 10:25 AM   Clinical Narrative:    RNCM spoke with patient by phone to provide list of agencies. She did not have preference of agencies and referral was made to [place] [facility]. She states she will be going to her brother [name] address [place] [address] at discharge. Darrell has purchased a bedside commode for her and rolling walker has been requested from [place] with [employer]; team Forestine Na will need to deliver from their closet. No other TOC needs.    Final next level of care: Home w Home Health Services Barriers to Discharge: No Barriers Identified   Patient Goals and CMS Choice Patient states their goals for this hospitalization and ongoing recovery are:: "I'm going to my brothers for a while" CMS Medicare.gov Compare Post Acute Care list provided to:: Patient Choice offered to / list presented to : Patient  Discharge Placement                       Discharge Plan and Services                DME Arranged: Walker rolling DME Agency: [employer] Date DME Agency Contacted: 07/08/19 Time DME Agency Contacted: 1024 Representative spoke with at DME Agency: Leroy Sea with [place]: PT [place] Agency: [place] ([place]) Date [place]: 07/08/19 Time [place]: 1025 Representative spoke with at [place]: [place] (SDOH) Interventions     Readmission Risk Interventions No flowsheet data found.

## 2019-07-08 NOTE — Discharge Instructions (Signed)
Toe-touch weightbearing on the right for 12 weeks No DVT prophylaxis needed after discharge Office visit in 2 weeks to remove staples and apply cast   IMPORTANT INFORMATION: PAY CLOSE [place]  Follow with Primary care provider  Berenice Primas  and other consultants as instructed by your Hospitalist Physician  [place] IF SYMPTOMS COME BACK, WORSEN OR NEW PROBLEM DEVELOPS   Please note: You were cared for by a hospitalist during your hospital stay. Every effort will be made to forward records to your primary care provider.  You can request that your primary care provider send for your hospital records if they have not received them.  Once you are discharged, your primary care physician will handle any further medical issues. Please note that NO REFILLS for any discharge medications will be authorized once you are discharged, as it is imperative that you return to your primary care physician (or establish a relationship with a primary care physician if you do not have one) for your post hospital discharge needs so that they can reassess your need for medications and monitor your lab values.  Please get a complete blood count and chemistry panel checked by your Primary MD at your next visit, and again as instructed by your Primary MD.  Get Medicines reviewed and adjusted: Please take all your medications with you for your next visit with your Primary MD  Laboratory/radiological data: Please request your Primary MD to go over all hospital tests and procedure/radiological results at the follow up, please ask your primary care provider to get all Hospital records sent to his/her office.  In some cases, they will be blood work, cultures and biopsy results pending at the time of your discharge. Please request that your primary care provider follow up on these results.  If you are diabetic, please bring your blood sugar  readings with you to your follow up appointment with primary care.    Please call and make your follow up appointments as soon as possible.    Also Note the following: If you experience worsening of your admission symptoms, develop shortness of breath, life threatening emergency, suicidal or homicidal thoughts you must seek medical attention immediately by calling 911 or calling your MD immediately  if symptoms less severe.  You must read complete instructions/literature along with all the possible adverse reactions/side effects for all the Medicines you take and that have been prescribed to you. Take any new Medicines after you have completely understood and accpet all the possible adverse reactions/side effects.   Do not drive when taking Pain medications or sleeping medications (Benzodiazepines)  Do not take more than prescribed Pain, Sleep and Anxiety Medications. It is not advisable to combine anxiety,sleep and pain medications without talking with your primary care practitioner  Special Instructions: If you have smoked or chewed Tobacco  in the last 2 yrs please stop smoking, stop any regular Alcohol  and or any Recreational drug use.  Wear Seat belts while driving.  Do not drive if taking any narcotic, mind altering or controlled substances or recreational drugs or alcohol.

## 2019-07-09 DIAGNOSIS — Z90710 Acquired absence of both cervix and uterus: Secondary | ICD-10-CM | POA: Diagnosis not present

## 2019-07-09 DIAGNOSIS — W010XXD Fall on same level from slipping, tripping and stumbling without subsequent striking against object, subsequent encounter: Secondary | ICD-10-CM | POA: Diagnosis not present

## 2019-07-09 DIAGNOSIS — E119 Type 2 diabetes mellitus without complications: Secondary | ICD-10-CM | POA: Diagnosis not present

## 2019-07-09 DIAGNOSIS — Z7984 Long term (current) use of oral hypoglycemic drugs: Secondary | ICD-10-CM | POA: Diagnosis not present

## 2019-07-09 DIAGNOSIS — S9304XD Dislocation of right ankle joint, subsequent encounter: Secondary | ICD-10-CM | POA: Diagnosis not present

## 2019-07-09 DIAGNOSIS — Z9181 History of falling: Secondary | ICD-10-CM | POA: Diagnosis not present

## 2019-07-09 DIAGNOSIS — C541 Malignant neoplasm of endometrium: Secondary | ICD-10-CM | POA: Diagnosis not present

## 2019-07-09 DIAGNOSIS — Z90722 Acquired absence of ovaries, bilateral: Secondary | ICD-10-CM | POA: Diagnosis not present

## 2019-07-09 DIAGNOSIS — S82851D Displaced trimalleolar fracture of right lower leg, subsequent encounter for closed fracture with routine healing: Secondary | ICD-10-CM | POA: Diagnosis not present

## 2019-07-09 DIAGNOSIS — I10 Essential (primary) hypertension: Secondary | ICD-10-CM | POA: Diagnosis not present

## 2019-07-09 DIAGNOSIS — M170 Bilateral primary osteoarthritis of knee: Secondary | ICD-10-CM | POA: Diagnosis not present

## 2019-07-09 DIAGNOSIS — Z7982 Long term (current) use of aspirin: Secondary | ICD-10-CM | POA: Diagnosis not present

## 2019-07-09 DIAGNOSIS — K219 Gastro-esophageal reflux disease without esophagitis: Secondary | ICD-10-CM | POA: Diagnosis not present

## 2019-07-11 DIAGNOSIS — S9304XD Dislocation of right ankle joint, subsequent encounter: Secondary | ICD-10-CM | POA: Diagnosis not present

## 2019-07-11 DIAGNOSIS — E119 Type 2 diabetes mellitus without complications: Secondary | ICD-10-CM | POA: Diagnosis not present

## 2019-07-11 DIAGNOSIS — I10 Essential (primary) hypertension: Secondary | ICD-10-CM | POA: Diagnosis not present

## 2019-07-11 DIAGNOSIS — C541 Malignant neoplasm of endometrium: Secondary | ICD-10-CM | POA: Diagnosis not present

## 2019-07-11 DIAGNOSIS — M170 Bilateral primary osteoarthritis of knee: Secondary | ICD-10-CM | POA: Diagnosis not present

## 2019-07-11 DIAGNOSIS — S82851D Displaced trimalleolar fracture of right lower leg, subsequent encounter for closed fracture with routine healing: Secondary | ICD-10-CM | POA: Diagnosis not present

## 2019-07-13 DIAGNOSIS — Z9889 Other specified postprocedural states: Secondary | ICD-10-CM | POA: Diagnosis not present

## 2019-07-13 DIAGNOSIS — Z09 Encounter for follow-up examination after completed treatment for conditions other than malignant neoplasm: Secondary | ICD-10-CM | POA: Diagnosis not present

## 2019-07-13 DIAGNOSIS — Z6841 Body Mass Index (BMI) 40.0 and over, adult: Secondary | ICD-10-CM | POA: Diagnosis not present

## 2019-07-13 DIAGNOSIS — Z299 Encounter for prophylactic measures, unspecified: Secondary | ICD-10-CM | POA: Diagnosis not present

## 2019-07-13 DIAGNOSIS — Z8781 Personal history of (healed) traumatic fracture: Secondary | ICD-10-CM | POA: Diagnosis not present

## 2019-07-13 DIAGNOSIS — S82899A Other fracture of unspecified lower leg, initial encounter for closed fracture: Secondary | ICD-10-CM | POA: Diagnosis not present

## 2019-07-15 DIAGNOSIS — M170 Bilateral primary osteoarthritis of knee: Secondary | ICD-10-CM | POA: Diagnosis not present

## 2019-07-15 DIAGNOSIS — I10 Essential (primary) hypertension: Secondary | ICD-10-CM | POA: Diagnosis not present

## 2019-07-15 DIAGNOSIS — S82851D Displaced trimalleolar fracture of right lower leg, subsequent encounter for closed fracture with routine healing: Secondary | ICD-10-CM | POA: Diagnosis not present

## 2019-07-15 DIAGNOSIS — C541 Malignant neoplasm of endometrium: Secondary | ICD-10-CM | POA: Diagnosis not present

## 2019-07-15 DIAGNOSIS — E119 Type 2 diabetes mellitus without complications: Secondary | ICD-10-CM | POA: Diagnosis not present

## 2019-07-15 DIAGNOSIS — S9304XD Dislocation of right ankle joint, subsequent encounter: Secondary | ICD-10-CM | POA: Diagnosis not present

## 2019-07-18 DIAGNOSIS — C541 Malignant neoplasm of endometrium: Secondary | ICD-10-CM | POA: Diagnosis not present

## 2019-07-18 DIAGNOSIS — S9304XD Dislocation of right ankle joint, subsequent encounter: Secondary | ICD-10-CM | POA: Diagnosis not present

## 2019-07-18 DIAGNOSIS — M170 Bilateral primary osteoarthritis of knee: Secondary | ICD-10-CM | POA: Diagnosis not present

## 2019-07-18 DIAGNOSIS — S82851D Displaced trimalleolar fracture of right lower leg, subsequent encounter for closed fracture with routine healing: Secondary | ICD-10-CM | POA: Diagnosis not present

## 2019-07-18 DIAGNOSIS — I10 Essential (primary) hypertension: Secondary | ICD-10-CM | POA: Diagnosis not present

## 2019-07-18 DIAGNOSIS — E119 Type 2 diabetes mellitus without complications: Secondary | ICD-10-CM | POA: Diagnosis not present

## 2019-07-20 DIAGNOSIS — S9304XD Dislocation of right ankle joint, subsequent encounter: Secondary | ICD-10-CM | POA: Diagnosis not present

## 2019-07-20 DIAGNOSIS — C541 Malignant neoplasm of endometrium: Secondary | ICD-10-CM | POA: Diagnosis not present

## 2019-07-20 DIAGNOSIS — M170 Bilateral primary osteoarthritis of knee: Secondary | ICD-10-CM | POA: Diagnosis not present

## 2019-07-20 DIAGNOSIS — S82851D Displaced trimalleolar fracture of right lower leg, subsequent encounter for closed fracture with routine healing: Secondary | ICD-10-CM | POA: Diagnosis not present

## 2019-07-20 DIAGNOSIS — E119 Type 2 diabetes mellitus without complications: Secondary | ICD-10-CM | POA: Diagnosis not present

## 2019-07-20 DIAGNOSIS — I10 Essential (primary) hypertension: Secondary | ICD-10-CM | POA: Diagnosis not present

## 2019-07-21 ENCOUNTER — Telehealth: Payer: Self-pay | Admitting: Licensed Clinical Social Worker

## 2019-07-21 ENCOUNTER — Ambulatory Visit: Payer: Medicare Other

## 2019-07-21 ENCOUNTER — Other Ambulatory Visit: Payer: Self-pay

## 2019-07-21 ENCOUNTER — Encounter: Payer: Self-pay | Admitting: Orthopedic Surgery

## 2019-07-21 ENCOUNTER — Ambulatory Visit (INDEPENDENT_AMBULATORY_CARE_PROVIDER_SITE_OTHER): Payer: Medicare Other | Admitting: Orthopedic Surgery

## 2019-07-21 DIAGNOSIS — S82841D Displaced bimalleolar fracture of right lower leg, subsequent encounter for closed fracture with routine healing: Secondary | ICD-10-CM

## 2019-07-21 DIAGNOSIS — Z8781 Personal history of (healed) traumatic fracture: Secondary | ICD-10-CM

## 2019-07-21 DIAGNOSIS — Z4889 Encounter for other specified surgical aftercare: Secondary | ICD-10-CM

## 2019-07-21 DIAGNOSIS — Z9889 Other specified postprocedural states: Secondary | ICD-10-CM

## 2019-07-21 NOTE — Telephone Encounter (Signed)
Returned patient's phone call regarding cancelling 01/11 appointment, per patient's request appointment cancelled. Let a voicemail for patient to rescheduled.

## 2019-07-21 NOTE — Patient Instructions (Addendum)
No weight bearing except on the toes

## 2019-07-21 NOTE — Progress Notes (Signed)
Postop visit  Postop status post ORIF right ankle  Trimalleolar fracture treated with bimalleolar fixation including syndesmosis stabilization with multiple screws  Patient's wounds look good x-ray looks good short leg cast applied to stretch out that Achilles  Follow-up 2 weeks remove the cast check the wounds repeat cast versus brace to start active range of motion  Toe-touch weightbearing for balance  Encounter Diagnoses  Name Primary?  . Closed bimalleolar fracture of right ankle with routine healing, subsequent encounter Yes  . S/P ORIF (open reduction internal fixation) fracture right ankle 07/06/2019   . Aftercare following surgery

## 2019-07-24 ENCOUNTER — Inpatient Hospital Stay: Payer: Medicare Other

## 2019-07-24 ENCOUNTER — Encounter: Payer: No Typology Code available for payment source | Admitting: Licensed Clinical Social Worker

## 2019-07-24 ENCOUNTER — Encounter (HOSPITAL_COMMUNITY): Payer: Self-pay | Admitting: Gynecologic Oncology

## 2019-07-25 DIAGNOSIS — I10 Essential (primary) hypertension: Secondary | ICD-10-CM | POA: Diagnosis not present

## 2019-07-25 DIAGNOSIS — C541 Malignant neoplasm of endometrium: Secondary | ICD-10-CM | POA: Diagnosis not present

## 2019-07-25 DIAGNOSIS — S9304XD Dislocation of right ankle joint, subsequent encounter: Secondary | ICD-10-CM | POA: Diagnosis not present

## 2019-07-25 DIAGNOSIS — E119 Type 2 diabetes mellitus without complications: Secondary | ICD-10-CM | POA: Diagnosis not present

## 2019-07-25 DIAGNOSIS — S82851D Displaced trimalleolar fracture of right lower leg, subsequent encounter for closed fracture with routine healing: Secondary | ICD-10-CM | POA: Diagnosis not present

## 2019-07-25 DIAGNOSIS — M170 Bilateral primary osteoarthritis of knee: Secondary | ICD-10-CM | POA: Diagnosis not present

## 2019-07-26 ENCOUNTER — Telehealth: Payer: Self-pay | Admitting: Oncology

## 2019-07-26 NOTE — Telephone Encounter (Signed)
Left a message for Rasheema regarding genetic counseling.  Requested a return call.

## 2019-07-27 ENCOUNTER — Telehealth: Payer: Self-pay | Admitting: Oncology

## 2019-07-27 DIAGNOSIS — E119 Type 2 diabetes mellitus without complications: Secondary | ICD-10-CM | POA: Diagnosis not present

## 2019-07-27 DIAGNOSIS — C541 Malignant neoplasm of endometrium: Secondary | ICD-10-CM | POA: Diagnosis not present

## 2019-07-27 DIAGNOSIS — I10 Essential (primary) hypertension: Secondary | ICD-10-CM | POA: Diagnosis not present

## 2019-07-27 DIAGNOSIS — S9304XD Dislocation of right ankle joint, subsequent encounter: Secondary | ICD-10-CM | POA: Diagnosis not present

## 2019-07-27 DIAGNOSIS — S82851D Displaced trimalleolar fracture of right lower leg, subsequent encounter for closed fracture with routine healing: Secondary | ICD-10-CM | POA: Diagnosis not present

## 2019-07-27 DIAGNOSIS — M170 Bilateral primary osteoarthritis of knee: Secondary | ICD-10-CM | POA: Diagnosis not present

## 2019-07-27 NOTE — Telephone Encounter (Signed)
Called Amilee to discuss genetic counseling.  She said she does not want to have it done at this time.  Advised her to call back if she changes her mind.

## 2019-08-02 DIAGNOSIS — M170 Bilateral primary osteoarthritis of knee: Secondary | ICD-10-CM | POA: Diagnosis not present

## 2019-08-02 DIAGNOSIS — S9304XD Dislocation of right ankle joint, subsequent encounter: Secondary | ICD-10-CM | POA: Diagnosis not present

## 2019-08-02 DIAGNOSIS — E119 Type 2 diabetes mellitus without complications: Secondary | ICD-10-CM | POA: Diagnosis not present

## 2019-08-02 DIAGNOSIS — C541 Malignant neoplasm of endometrium: Secondary | ICD-10-CM | POA: Diagnosis not present

## 2019-08-02 DIAGNOSIS — S82851D Displaced trimalleolar fracture of right lower leg, subsequent encounter for closed fracture with routine healing: Secondary | ICD-10-CM | POA: Diagnosis not present

## 2019-08-02 DIAGNOSIS — I10 Essential (primary) hypertension: Secondary | ICD-10-CM | POA: Diagnosis not present

## 2019-08-03 ENCOUNTER — Ambulatory Visit: Payer: No Typology Code available for payment source | Admitting: Gynecologic Oncology

## 2019-08-04 ENCOUNTER — Other Ambulatory Visit: Payer: Self-pay

## 2019-08-04 ENCOUNTER — Encounter: Payer: Self-pay | Admitting: Orthopedic Surgery

## 2019-08-04 ENCOUNTER — Ambulatory Visit (INDEPENDENT_AMBULATORY_CARE_PROVIDER_SITE_OTHER): Payer: Medicare Other | Admitting: Orthopedic Surgery

## 2019-08-04 ENCOUNTER — Ambulatory Visit: Payer: Medicare Other

## 2019-08-04 DIAGNOSIS — Z9889 Other specified postprocedural states: Secondary | ICD-10-CM | POA: Diagnosis not present

## 2019-08-04 DIAGNOSIS — S82851D Displaced trimalleolar fracture of right lower leg, subsequent encounter for closed fracture with routine healing: Secondary | ICD-10-CM

## 2019-08-04 DIAGNOSIS — Z8781 Personal history of (healed) traumatic fracture: Secondary | ICD-10-CM

## 2019-08-04 DIAGNOSIS — S82841D Displaced bimalleolar fracture of right lower leg, subsequent encounter for closed fracture with routine healing: Secondary | ICD-10-CM

## 2019-08-04 NOTE — Progress Notes (Signed)
Postop  Trimalleolar fracture right ankle with open treatment internal fixation on December 24 this is postop day number 29-week #4  Both wounds look good ankle motion is coming around nicely with plantigrade foot  X-ray looks good  Recommend cam walker no weightbearing  Remove cam walker 3 times a day for exercises  X-ray 4 weeks  Encounter Diagnoses  Name Primary?  . Closed trimalleolar fracture of right ankle with routine healing, subsequent encounter Yes  . S/P ORIF (open reduction internal fixation) fracture right ankle 07/06/2019

## 2019-08-04 NOTE — Patient Instructions (Signed)
Didi everything is looking very good  No weightbearing other than what we have done for balance  Come back in 4 weeks for x-ray  Exercise the ankle 25 times in each direction 3 times a day with the boot off

## 2019-08-08 DIAGNOSIS — C541 Malignant neoplasm of endometrium: Secondary | ICD-10-CM | POA: Diagnosis not present

## 2019-08-08 DIAGNOSIS — S9304XD Dislocation of right ankle joint, subsequent encounter: Secondary | ICD-10-CM | POA: Diagnosis not present

## 2019-08-08 DIAGNOSIS — W010XXD Fall on same level from slipping, tripping and stumbling without subsequent striking against object, subsequent encounter: Secondary | ICD-10-CM | POA: Diagnosis not present

## 2019-08-08 DIAGNOSIS — M170 Bilateral primary osteoarthritis of knee: Secondary | ICD-10-CM | POA: Diagnosis not present

## 2019-08-08 DIAGNOSIS — Z90722 Acquired absence of ovaries, bilateral: Secondary | ICD-10-CM | POA: Diagnosis not present

## 2019-08-08 DIAGNOSIS — K219 Gastro-esophageal reflux disease without esophagitis: Secondary | ICD-10-CM | POA: Diagnosis not present

## 2019-08-08 DIAGNOSIS — S82851D Displaced trimalleolar fracture of right lower leg, subsequent encounter for closed fracture with routine healing: Secondary | ICD-10-CM | POA: Diagnosis not present

## 2019-08-08 DIAGNOSIS — Z7982 Long term (current) use of aspirin: Secondary | ICD-10-CM | POA: Diagnosis not present

## 2019-08-08 DIAGNOSIS — E119 Type 2 diabetes mellitus without complications: Secondary | ICD-10-CM | POA: Diagnosis not present

## 2019-08-08 DIAGNOSIS — Z7984 Long term (current) use of oral hypoglycemic drugs: Secondary | ICD-10-CM | POA: Diagnosis not present

## 2019-08-08 DIAGNOSIS — Z9181 History of falling: Secondary | ICD-10-CM | POA: Diagnosis not present

## 2019-08-08 DIAGNOSIS — I10 Essential (primary) hypertension: Secondary | ICD-10-CM | POA: Diagnosis not present

## 2019-08-08 DIAGNOSIS — Z90710 Acquired absence of both cervix and uterus: Secondary | ICD-10-CM | POA: Diagnosis not present

## 2019-08-09 DIAGNOSIS — E119 Type 2 diabetes mellitus without complications: Secondary | ICD-10-CM | POA: Diagnosis not present

## 2019-08-09 DIAGNOSIS — M170 Bilateral primary osteoarthritis of knee: Secondary | ICD-10-CM | POA: Diagnosis not present

## 2019-08-09 DIAGNOSIS — I10 Essential (primary) hypertension: Secondary | ICD-10-CM | POA: Diagnosis not present

## 2019-08-09 DIAGNOSIS — S82851D Displaced trimalleolar fracture of right lower leg, subsequent encounter for closed fracture with routine healing: Secondary | ICD-10-CM | POA: Diagnosis not present

## 2019-08-09 DIAGNOSIS — S9304XD Dislocation of right ankle joint, subsequent encounter: Secondary | ICD-10-CM | POA: Diagnosis not present

## 2019-08-09 DIAGNOSIS — C541 Malignant neoplasm of endometrium: Secondary | ICD-10-CM | POA: Diagnosis not present

## 2019-08-15 ENCOUNTER — Ambulatory Visit: Payer: No Typology Code available for payment source | Admitting: Gynecologic Oncology

## 2019-08-15 DIAGNOSIS — I10 Essential (primary) hypertension: Secondary | ICD-10-CM | POA: Diagnosis not present

## 2019-08-15 DIAGNOSIS — S9304XD Dislocation of right ankle joint, subsequent encounter: Secondary | ICD-10-CM | POA: Diagnosis not present

## 2019-08-15 DIAGNOSIS — E119 Type 2 diabetes mellitus without complications: Secondary | ICD-10-CM | POA: Diagnosis not present

## 2019-08-15 DIAGNOSIS — C541 Malignant neoplasm of endometrium: Secondary | ICD-10-CM | POA: Diagnosis not present

## 2019-08-15 DIAGNOSIS — M170 Bilateral primary osteoarthritis of knee: Secondary | ICD-10-CM | POA: Diagnosis not present

## 2019-08-15 DIAGNOSIS — S82851D Displaced trimalleolar fracture of right lower leg, subsequent encounter for closed fracture with routine healing: Secondary | ICD-10-CM | POA: Diagnosis not present

## 2019-09-01 ENCOUNTER — Encounter: Payer: Self-pay | Admitting: Orthopedic Surgery

## 2019-09-01 ENCOUNTER — Ambulatory Visit: Payer: Medicare Other

## 2019-09-01 ENCOUNTER — Ambulatory Visit (INDEPENDENT_AMBULATORY_CARE_PROVIDER_SITE_OTHER): Payer: Medicare Other | Admitting: Orthopedic Surgery

## 2019-09-01 ENCOUNTER — Other Ambulatory Visit: Payer: Self-pay

## 2019-09-01 DIAGNOSIS — S82851D Displaced trimalleolar fracture of right lower leg, subsequent encounter for closed fracture with routine healing: Secondary | ICD-10-CM

## 2019-09-01 DIAGNOSIS — Z8781 Personal history of (healed) traumatic fracture: Secondary | ICD-10-CM | POA: Diagnosis not present

## 2019-09-01 DIAGNOSIS — Z6841 Body Mass Index (BMI) 40.0 and over, adult: Secondary | ICD-10-CM | POA: Diagnosis not present

## 2019-09-01 DIAGNOSIS — Z299 Encounter for prophylactic measures, unspecified: Secondary | ICD-10-CM | POA: Diagnosis not present

## 2019-09-01 DIAGNOSIS — L0390 Cellulitis, unspecified: Secondary | ICD-10-CM | POA: Diagnosis not present

## 2019-09-01 DIAGNOSIS — Z9889 Other specified postprocedural states: Secondary | ICD-10-CM | POA: Diagnosis not present

## 2019-09-01 DIAGNOSIS — I10 Essential (primary) hypertension: Secondary | ICD-10-CM | POA: Diagnosis not present

## 2019-09-01 DIAGNOSIS — S82891S Other fracture of right lower leg, sequela: Secondary | ICD-10-CM | POA: Diagnosis not present

## 2019-09-01 DIAGNOSIS — C541 Malignant neoplasm of endometrium: Secondary | ICD-10-CM | POA: Diagnosis not present

## 2019-09-01 DIAGNOSIS — E1165 Type 2 diabetes mellitus with hyperglycemia: Secondary | ICD-10-CM | POA: Diagnosis not present

## 2019-09-01 MED ORDER — CEPHALEXIN 500 MG PO CAPS: 500.0000 mg | ORAL_CAPSULE | Freq: Two times a day (BID) | ORAL | 0 refills | Status: DC

## 2019-09-01 NOTE — Patient Instructions (Signed)
NO WEIGHT BEARING   START ANTIBIOTIC

## 2019-09-01 NOTE — Progress Notes (Signed)
  Chief Complaint  Patient presents with  . Post-op Follow-up    has some redness at incision is concerned about  07/06/19    Postop ORIF trimalleolar right ankle fracture she is about 2 months out she is concerned about some redness on the lateral incision  Her ankle motion is excellent she is in a cam walker nonweightbearing x-rays look great her ankle wound shows some scabbing laterally there is some mild erythema around the scabs  It does not look infected however, I am limited on antibiotics  Meds ordered this encounter  Medications  . cephALEXin (KEFLEX) 500 MG capsule    Sig: Take 1 capsule (500 mg total) by mouth 2 (two) times daily.    Dispense:  28 capsule    Refill:  0    No weightbearing follow-up for x-ray in a month

## 2019-09-05 DIAGNOSIS — E119 Type 2 diabetes mellitus without complications: Secondary | ICD-10-CM | POA: Diagnosis not present

## 2019-09-05 DIAGNOSIS — C541 Malignant neoplasm of endometrium: Secondary | ICD-10-CM | POA: Diagnosis not present

## 2019-09-05 DIAGNOSIS — S82851D Displaced trimalleolar fracture of right lower leg, subsequent encounter for closed fracture with routine healing: Secondary | ICD-10-CM | POA: Diagnosis not present

## 2019-09-05 DIAGNOSIS — S9304XD Dislocation of right ankle joint, subsequent encounter: Secondary | ICD-10-CM | POA: Diagnosis not present

## 2019-09-05 DIAGNOSIS — M170 Bilateral primary osteoarthritis of knee: Secondary | ICD-10-CM | POA: Diagnosis not present

## 2019-09-05 DIAGNOSIS — I10 Essential (primary) hypertension: Secondary | ICD-10-CM | POA: Diagnosis not present

## 2019-09-06 DIAGNOSIS — Z23 Encounter for immunization: Secondary | ICD-10-CM | POA: Diagnosis not present

## 2019-09-29 ENCOUNTER — Ambulatory Visit: Payer: Medicare Other | Admitting: Orthopedic Surgery

## 2019-10-04 DIAGNOSIS — Z23 Encounter for immunization: Secondary | ICD-10-CM | POA: Diagnosis not present

## 2019-10-06 ENCOUNTER — Ambulatory Visit (INDEPENDENT_AMBULATORY_CARE_PROVIDER_SITE_OTHER): Payer: Medicare Other | Admitting: Orthopedic Surgery

## 2019-10-06 ENCOUNTER — Ambulatory Visit: Payer: Medicare Other

## 2019-10-06 ENCOUNTER — Other Ambulatory Visit: Payer: Self-pay

## 2019-10-06 VITALS — Temp 97.2°F | Ht 68.0 in | Wt 269.0 lb

## 2019-10-06 DIAGNOSIS — Z8781 Personal history of (healed) traumatic fracture: Secondary | ICD-10-CM

## 2019-10-06 DIAGNOSIS — Z9889 Other specified postprocedural states: Secondary | ICD-10-CM

## 2019-10-06 DIAGNOSIS — S82851D Displaced trimalleolar fracture of right lower leg, subsequent encounter for closed fracture with routine healing: Secondary | ICD-10-CM

## 2019-10-06 NOTE — Progress Notes (Signed)
Chief Complaint  Patient presents with  . Follow-up    Recheck on right ankle, DOI 07-06-19.    Encounter Diagnoses  Name Primary?  . S/P ORIF (open reduction internal fixation) fracture right ankle 07/06/2019 Yes  . Closed trimalleolar fracture of right ankle with routine healing, subsequent encounter       3 months after ORIF of the right ankle the patient had some concern about cellulitis around the incision laterally and was placed on Keflex 4 weeks ago  She presents for follow-up in 81-month x-ray  She says she is feeling good  The wound issue has resolved.  She has excellent range of motion now.  Her x-ray looks good.  Recommend weight-bear as tolerated in the boot for a month and then rex-ray.

## 2019-11-03 ENCOUNTER — Encounter: Payer: Self-pay | Admitting: Orthopedic Surgery

## 2019-11-03 ENCOUNTER — Other Ambulatory Visit: Payer: Self-pay

## 2019-11-03 ENCOUNTER — Ambulatory Visit (INDEPENDENT_AMBULATORY_CARE_PROVIDER_SITE_OTHER): Payer: Medicare Other | Admitting: Orthopedic Surgery

## 2019-11-03 ENCOUNTER — Ambulatory Visit: Payer: Medicare Other

## 2019-11-03 DIAGNOSIS — Z8781 Personal history of (healed) traumatic fracture: Secondary | ICD-10-CM

## 2019-11-03 DIAGNOSIS — S82851D Displaced trimalleolar fracture of right lower leg, subsequent encounter for closed fracture with routine healing: Secondary | ICD-10-CM | POA: Diagnosis not present

## 2019-11-03 DIAGNOSIS — Z9889 Other specified postprocedural states: Secondary | ICD-10-CM

## 2019-11-03 NOTE — Progress Notes (Signed)
Chief Complaint  Patient presents with  . Ankle Pain    07/06/19 right ankle fracture surgery     Encounter Diagnoses  Name Primary?  . S/P ORIF (open reduction internal fixation) fracture right ankle 07/06/2019   . Closed trimalleolar fracture of right ankle with routine healing, subsequent encounter Yes    _____________________________________________________________________________________________________  " I "m doing well I think "  Nicole Sampson continues to improve she has been ambulatory in the boot without any difficulty  Her wounds are fine there is no complications from the hardware she has adequate ankle motion and her x-ray shows fracture healing without hardware complication  Recommend follow-up at  a year from postop for x-rays

## 2019-11-03 NOTE — Patient Instructions (Signed)
Be careful on uneven ground   yiu can remove the boot   See me in december

## 2019-12-08 DIAGNOSIS — C541 Malignant neoplasm of endometrium: Secondary | ICD-10-CM | POA: Diagnosis not present

## 2019-12-08 DIAGNOSIS — Z6836 Body mass index (BMI) 36.0-36.9, adult: Secondary | ICD-10-CM | POA: Diagnosis not present

## 2019-12-08 DIAGNOSIS — I10 Essential (primary) hypertension: Secondary | ICD-10-CM | POA: Diagnosis not present

## 2019-12-08 DIAGNOSIS — Z299 Encounter for prophylactic measures, unspecified: Secondary | ICD-10-CM | POA: Diagnosis not present

## 2019-12-08 DIAGNOSIS — D692 Other nonthrombocytopenic purpura: Secondary | ICD-10-CM | POA: Diagnosis not present

## 2019-12-08 DIAGNOSIS — E1165 Type 2 diabetes mellitus with hyperglycemia: Secondary | ICD-10-CM | POA: Diagnosis not present

## 2019-12-14 ENCOUNTER — Inpatient Hospital Stay: Payer: Medicare Other | Attending: Gynecologic Oncology | Admitting: Gynecologic Oncology

## 2019-12-14 ENCOUNTER — Other Ambulatory Visit: Payer: Self-pay

## 2019-12-14 VITALS — BP 152/52 | HR 79 | Temp 98.4°F | Resp 18 | Ht 68.0 in | Wt 235.8 lb

## 2019-12-14 DIAGNOSIS — Z90722 Acquired absence of ovaries, bilateral: Secondary | ICD-10-CM | POA: Diagnosis not present

## 2019-12-14 DIAGNOSIS — Z6841 Body Mass Index (BMI) 40.0 and over, adult: Secondary | ICD-10-CM | POA: Diagnosis not present

## 2019-12-14 DIAGNOSIS — Z90710 Acquired absence of both cervix and uterus: Secondary | ICD-10-CM | POA: Diagnosis not present

## 2019-12-14 DIAGNOSIS — K219 Gastro-esophageal reflux disease without esophagitis: Secondary | ICD-10-CM | POA: Diagnosis not present

## 2019-12-14 DIAGNOSIS — M1990 Unspecified osteoarthritis, unspecified site: Secondary | ICD-10-CM | POA: Insufficient documentation

## 2019-12-14 DIAGNOSIS — Z7984 Long term (current) use of oral hypoglycemic drugs: Secondary | ICD-10-CM | POA: Diagnosis not present

## 2019-12-14 DIAGNOSIS — Z8542 Personal history of malignant neoplasm of other parts of uterus: Secondary | ICD-10-CM | POA: Insufficient documentation

## 2019-12-14 DIAGNOSIS — Z7982 Long term (current) use of aspirin: Secondary | ICD-10-CM | POA: Diagnosis not present

## 2019-12-14 DIAGNOSIS — C541 Malignant neoplasm of endometrium: Secondary | ICD-10-CM | POA: Diagnosis not present

## 2019-12-14 DIAGNOSIS — Z8049 Family history of malignant neoplasm of other genital organs: Secondary | ICD-10-CM | POA: Diagnosis not present

## 2019-12-14 DIAGNOSIS — Z79899 Other long term (current) drug therapy: Secondary | ICD-10-CM | POA: Diagnosis not present

## 2019-12-14 DIAGNOSIS — E119 Type 2 diabetes mellitus without complications: Secondary | ICD-10-CM | POA: Diagnosis not present

## 2019-12-14 DIAGNOSIS — I10 Essential (primary) hypertension: Secondary | ICD-10-CM | POA: Diagnosis not present

## 2019-12-14 DIAGNOSIS — Z8249 Family history of ischemic heart disease and other diseases of the circulatory system: Secondary | ICD-10-CM | POA: Diagnosis not present

## 2019-12-14 DIAGNOSIS — Z9079 Acquired absence of other genital organ(s): Secondary | ICD-10-CM | POA: Insufficient documentation

## 2019-12-14 DIAGNOSIS — N898 Other specified noninflammatory disorders of vagina: Secondary | ICD-10-CM | POA: Insufficient documentation

## 2019-12-14 DIAGNOSIS — Z833 Family history of diabetes mellitus: Secondary | ICD-10-CM | POA: Diagnosis not present

## 2019-12-14 DIAGNOSIS — N760 Acute vaginitis: Secondary | ICD-10-CM | POA: Diagnosis not present

## 2019-12-14 NOTE — Patient Instructions (Signed)
Please notify Dr Denman George at phone number [phone] if you notice vaginal bleeding, new pelvic or abdominal pains, bloating, feeling full easy, or a change in bladder or bowel function.   Please return to see Dr Denman George in December.  Her office will call you with the results of today's biopsy.

## 2019-12-14 NOTE — Progress Notes (Signed)
Gynecologic Oncology Follow-up Note  Chief Complaint:  Chief Complaint  Patient presents with  . Endometrial cancer [facility])    Follow Up    Assessment/Plan:  [name]  is a 69 y.o.  A history of stage IA grade 1 endometrial adenocarcinoma s/p robotic staging on 06/15/19. MSI High with loss of MLH1. Declined genetics evaluation.  Pathology revealed low risk factors for recurrence, therefore no adjuvant therapy was recommended according to NCCN guidelines.  Granulation tissue vs recurrence on today's exam (I favor granulation tissue).  I discussed risk for recurrence and typical symptoms encouraged her to notify us of these should they develop between visits.  I recommend she have follow-up every 6 months for 5 years in accordance with NCCN guidelines. Those visits should include symptom assessment, physical exam and pelvic examination. Pap smears are not indicated or recommended in the routine surveillance of endometrial cancer.  HPI: [name] is a 69 year old P0 who is seen in consultation at the request of Dr Vanessa Kick for evaluation of endometrial cancer.  The patient reported having postmenopausal bleeding beginning in August 2020.  She had a scheduled gynecology visit with Dr. Harrington Challenger for October 2020 and decided she would bring it up at this time.  At her initial visit she had a Pap test that was normal.  She return for separate visit for further work-up of her bleeding.  At that visit a transvaginal ultrasound scan was performed which confirmed a thickened endometrial stripe this was performed on March 15, 2019.  An endometrial Pipelle biopsy was performed in the office which was benign.  She was then taken for hysteroscopy D&C procedure.  This was performed on May 11, 2019 and revealed grade 1 endometrial cancer.  Patient is morbidly obese with a BMI of 41 kg meters squared.  She has hypertension and diabetes mellitus.  She reports her last hemoglobin A1c earlier  this year was 6.3%.  She takes aspirin for heart health.  She does not take insulin.  She has never been pregnant.  She lives alone.  Her family history is significant for mother who had uterine cancer in her 21s who was overweight.  She has never had abdominal surgery.  On 06/15/19 she underwent a robotic assisted total hysterectomy with BSO and SLN biopsy. Intraoperative findings were unremarkable. Surgery was uncomplicated.  Final pathology revealed a FIGO stage IA grade 1 endometrial cancer with 28m of 15 mm myo invasion, no LVSI, negative nodes, adnexa and cervix. Loss of MLH1 was noted on IHC (MSI high).  She was offered genetics consultation but declined.  No adjuvant therapy was recommended in accordance with NCCN guidelines due to low risk factors in the specimen.  Interval Hx:  Since surgery she has broken her right ankle and required surgery. She reported no symptoms concerning for recurrence.  Current Meds:  Outpatient Encounter Medications as of 12/14/2019  Medication Sig  . ascorbic acid (VITAMIN C) 500 MG tablet Take 500 mg by mouth daily.  .Marland Kitchenaspirin EC 81 MG tablet Take 81 mg by mouth every other day.   . Calcium Citrate-Vitamin D (CALCIUM + D PO) Take 1 tablet by mouth daily.   . hydrochlorothiazide (MICROZIDE) 12.5 MG capsule Take 12.5 mg by mouth daily.  .Marland Kitchenlosartan (COZAAR) 50 MG tablet Take 50 mg by mouth daily.  . metFORMIN (GLUCOPHAGE) 500 MG tablet Take 500 mg by mouth daily with breakfast.   . Multiple Vitamin (MULTIVITAMIN) tablet Take 1 tablet by  mouth daily.  . Potassium 99 MG TABS Take 1 tablet by mouth daily as needed (for cramps).  . rosuvastatin (CRESTOR) 5 MG tablet Take 5 mg by mouth once a week.  . [DISCONTINUED] acetaminophen (TYLENOL) 325 MG tablet Take 2 tablets (650 mg total) by mouth every 6 (six) hours as needed for mild pain or moderate pain. (Patient not taking: Reported on 12/14/2019)  . [DISCONTINUED] docusate sodium (COLACE) 100 MG capsule Take  1 capsule (100 mg total) by mouth 2 (two) times daily as needed for mild constipation. (Patient not taking: Reported on 12/14/2019)   No facility-administered encounter medications on file as of 12/14/2019.    Allergy:  Allergies  Allergen Reactions  . Other Swelling and Other (See Comments)    "lime jello causes swelling"    Social Hx:   Social History   Socioeconomic History  . Marital status: Single    Spouse name: Not on file  . Number of children: Not on file  . Years of education: Not on file  . Highest education level: Not on file  Occupational History  . Not on file  Tobacco Use  . Smoking status: Never Smoker  . Smokeless tobacco: Never Used  Substance and Sexual Activity  . Alcohol use: Never  . Drug use: Never  . Sexual activity: Not on file  Other Topics Concern  . Not on file  Social History Narrative  . Not on file   Social Determinants of Health   Financial Resource Strain:   . Difficulty of Paying Living Expenses:   Food Insecurity:   . Worried About Charity fundraiser in the Last Year:   . Arboriculturist in the Last Year:   Transportation Needs:   . Film/video editor (Medical):   Marland Kitchen Lack of Transportation (Non-Medical):   Physical Activity:   . Days of Exercise per Week:   . Minutes of Exercise per Session:   Stress:   . Feeling of Stress :   Social Connections:   . Frequency of Communication with Friends and Family:   . Frequency of Social Gatherings with Friends and Family:   . Attends Religious Services:   . Active Member of Clubs or Organizations:   . Attends Archivist Meetings:   Marland Kitchen Marital Status:   Intimate Partner Violence:   . Fear of Current or Ex-Partner:   . Emotionally Abused:   Marland Kitchen Physically Abused:   . Sexually Abused:     Past Surgical Hx:  Past Surgical History:  Procedure Laterality Date  . BREAST MASS EXCISION Left 04/1996  . HYSTEROSCOPY WITH D & C N/A 05/11/2019   Procedure: DILATATION AND CURETTAGE  /HYSTEROSCOPY;  Surgeon: Vanessa Kick, MD;  Location: [facility];  Service: Gynecology;  Laterality: N/A;  . ORIF ANKLE FRACTURE Right 07/06/2019   Procedure: OPEN REDUCTION INTERNAL FIXATION (ORIF) ANKLE FRACTURE;  Surgeon: Carole Civil, MD;  Location: [facility];  Service: Orthopedics;  Laterality: Right;  . ROBOTIC ASSISTED TOTAL HYSTERECTOMY WITH BILATERAL SALPINGO OOPHERECTOMY Bilateral 06/15/2019   Procedure: XI ROBOTIC ASSISTED TOTAL HYSTERECTOMY GREATER THAN 250 GRAMS, WITH BILATERAL SALPINGO OOPHORECTOMY;  Surgeon: Everitt Amber, MD;  Location: [facility];  Service: Gynecology;  Laterality: Bilateral;  . SENTINEL NODE BIOPSY N/A 06/15/2019   Procedure: SENTINEL NODE BIOPSY;  Surgeon: Everitt Amber, MD;  Location: [facility];  Service: Gynecology;  Laterality: N/A;    Past Medical Hx:  Past Medical History:  Diagnosis Date  . Arthritis  knees  . Full dentures   . GERD (gastroesophageal reflux disease)   . Hypertension    followed by pcp  (05-10-2019  per pt never had a stress test)  . PMB (postmenopausal bleeding)   . Type 2 diabetes mellitus ([place])    followed by pcp  (05-10-2019 check's cbg twice weekly,  fasting cbg-- 103)  . Wears glasses     Past Gynecological History:  G0 No LMP recorded. Patient is postmenopausal.  Family Hx:  Family History  Problem Relation Age of Onset  . Uterine cancer Mother   . Diabetes Mother   . Hypertension Mother   . Diabetes Father   . Hypertension Brother     Review of Systems:  Constitutional  Feels well,    ENT Normal appearing ears and nares bilaterally Skin/Breast  No rash, sores, jaundice, itching, dryness Cardiovascular  No chest pain, shortness of breath, or edema  Pulmonary  No cough or wheeze.  Gastro Intestinal  No nausea, vomitting, or diarrhoea. No bright red blood per rectum, no abdominal pain, change in bowel movement, or constipation.  Genito Urinary  No frequency, urgency, dysuria, no vaginal  bleeding Musculo Skeletal  No myalgia, arthralgia, joint swelling or pain  Neurologic  No weakness, numbness, change in gait,  Psychology  No depression, anxiety, insomnia.   Vitals:  Blood pressure (!) 152/52, pulse 79, temperature 98.4 F (36.9 C), temperature source Tympanic, resp. rate 18, height 5' 8" (1.727 m), weight 235 lb 12.8 oz (107 kg), SpO2 100 %.  Physical Exam: WD in NAD Neck  Supple NROM, without any enlargements.  Lymph Node Survey No cervical supraclavicular or inguinal adenopathy Cardiovascular  Pulse normal rate, regularity and rhythm. S1 and S2 normal.  Lungs  Clear to auscultation bilateraly, without wheezes/crackles/rhonchi. Good air movement.  Skin  No rash/lesions/breakdown  Psychiatry  Alert and oriented to person, place, and time  Abdomen  Normoactive bowel sounds, abdomen soft, non-tender and obese without evidence of hernia. Well healed incisions Back No CVA tenderness Genito Urinary  Vulva/vagina: Normal external female genitalia.  No lesions. No discharge or bleeding.  Bladder/urethra:  No lesions or masses, well supported bladder  Vagina: vaginal cuff in tact, no bleeding, no lesions, no palpable masses  Cervix and uterus surgically absent  Adnexa: no palpable masses. Rectal  deferred Extremities  No bilateral cyanosis, clubbing or edema.  Procedure Note:  Preop Dx: vaginal mass, hx of endometrial cancer Postop Dx: same Procedure: vaginal biopsy Surgeon: Dorann Ou, MD EBL: scant Specimens: vaginal biopsy from right vaginal cuff Complications: none Procedure Details: The patient was examined, verbal consent and verbal timeout were performed.  Speculum was inserted to the vagina and a less than 1 cm tongue of granulation tissue versus recurrence was identified at the right vaginal fornix.  This was a frond-like friable piece of tissue.  It was biopsied using the Tischler forcep.  Hemostasis was enforced with silver nitrate.  Scant bleeding  was noted.  The patient tolerated the procedure well.  The specimen was sent for histopathology.   Thereasa Solo, MD  12/14/2019, 2:29 PM

## 2019-12-18 LAB — SURGICAL PATHOLOGY

## 2020-02-06 DIAGNOSIS — N183 Chronic kidney disease, stage 3 unspecified: Secondary | ICD-10-CM | POA: Diagnosis not present

## 2020-04-09 DIAGNOSIS — E1122 Type 2 diabetes mellitus with diabetic chronic kidney disease: Secondary | ICD-10-CM | POA: Diagnosis not present

## 2020-04-09 DIAGNOSIS — E1165 Type 2 diabetes mellitus with hyperglycemia: Secondary | ICD-10-CM | POA: Diagnosis not present

## 2020-04-09 DIAGNOSIS — I10 Essential (primary) hypertension: Secondary | ICD-10-CM | POA: Diagnosis not present

## 2020-04-09 DIAGNOSIS — E119 Type 2 diabetes mellitus without complications: Secondary | ICD-10-CM | POA: Diagnosis not present

## 2020-04-09 DIAGNOSIS — Z299 Encounter for prophylactic measures, unspecified: Secondary | ICD-10-CM | POA: Diagnosis not present

## 2020-04-09 DIAGNOSIS — C541 Malignant neoplasm of endometrium: Secondary | ICD-10-CM | POA: Diagnosis not present

## 2020-04-09 DIAGNOSIS — N183 Chronic kidney disease, stage 3 unspecified: Secondary | ICD-10-CM | POA: Diagnosis not present

## 2020-05-01 DIAGNOSIS — Z1231 Encounter for screening mammogram for malignant neoplasm of breast: Secondary | ICD-10-CM | POA: Diagnosis not present

## 2020-05-04 DIAGNOSIS — Z23 Encounter for immunization: Secondary | ICD-10-CM | POA: Diagnosis not present

## 2020-06-11 DIAGNOSIS — E2839 Other primary ovarian failure: Secondary | ICD-10-CM | POA: Diagnosis not present

## 2020-06-11 DIAGNOSIS — I10 Essential (primary) hypertension: Secondary | ICD-10-CM | POA: Diagnosis not present

## 2020-06-12 ENCOUNTER — Other Ambulatory Visit: Payer: Self-pay

## 2020-06-12 ENCOUNTER — Encounter: Payer: Self-pay | Admitting: Orthopedic Surgery

## 2020-06-12 ENCOUNTER — Ambulatory Visit: Payer: Medicare Other

## 2020-06-12 ENCOUNTER — Ambulatory Visit (INDEPENDENT_AMBULATORY_CARE_PROVIDER_SITE_OTHER): Payer: Medicare Other | Admitting: Orthopedic Surgery

## 2020-06-12 VITALS — BP 194/95 | HR 85 | Ht 68.0 in | Wt 243.0 lb

## 2020-06-12 DIAGNOSIS — S82851D Displaced trimalleolar fracture of right lower leg, subsequent encounter for closed fracture with routine healing: Secondary | ICD-10-CM | POA: Diagnosis not present

## 2020-06-12 DIAGNOSIS — Z9889 Other specified postprocedural states: Secondary | ICD-10-CM | POA: Diagnosis not present

## 2020-06-12 DIAGNOSIS — Z8781 Personal history of (healed) traumatic fracture: Secondary | ICD-10-CM

## 2020-06-12 NOTE — Progress Notes (Signed)
Chief Complaint  Patient presents with  . Routine Post Op    right ankle ORIF 07/06/19   69 yo status post ORIF trimalleolar fracture right ankle back on December 2020  Bimalleolar fixation including extra syndesmotic fixation  She has no complaints  She is able to walk without any difficulty  Physical Exam Constitutional:      General: She is not in acute distress.    Appearance: She is well-developed.  Cardiovascular:     Comments: No peripheral edema Skin:    General: Skin is warm and dry.  Neurological:     Mental Status: She is alert and oriented to person, place, and time.     Sensory: No sensory deficit.     Coordination: Coordination normal.     Gait: Gait normal.    She has normal ankle motion without swelling she has no pain or tenderness.  No protruding hardware.  X-ray see report x-ray shows fixation is intact she has 1 prominent pin from the medial side but it is not symptomatic there is a halo around one of the screws but it is intact without any loss of position  I think this is okay to let her continue with normal activities and follow-up with [place] if she has any difficulty

## 2020-07-02 ENCOUNTER — Encounter: Payer: Self-pay | Admitting: Gynecologic Oncology

## 2020-07-04 ENCOUNTER — Inpatient Hospital Stay: Payer: Medicare Other | Attending: Gynecologic Oncology | Admitting: Gynecologic Oncology

## 2020-07-04 ENCOUNTER — Other Ambulatory Visit: Payer: Self-pay

## 2020-07-04 ENCOUNTER — Encounter: Payer: Self-pay | Admitting: Gynecologic Oncology

## 2020-07-04 VITALS — BP 148/59 | HR 78 | Temp 97.2°F | Resp 18 | Ht 68.0 in | Wt 247.6 lb

## 2020-07-04 DIAGNOSIS — Z8249 Family history of ischemic heart disease and other diseases of the circulatory system: Secondary | ICD-10-CM | POA: Insufficient documentation

## 2020-07-04 DIAGNOSIS — Z7984 Long term (current) use of oral hypoglycemic drugs: Secondary | ICD-10-CM | POA: Insufficient documentation

## 2020-07-04 DIAGNOSIS — I10 Essential (primary) hypertension: Secondary | ICD-10-CM | POA: Insufficient documentation

## 2020-07-04 DIAGNOSIS — Z8542 Personal history of malignant neoplasm of other parts of uterus: Secondary | ICD-10-CM | POA: Diagnosis not present

## 2020-07-04 DIAGNOSIS — Z90722 Acquired absence of ovaries, bilateral: Secondary | ICD-10-CM | POA: Insufficient documentation

## 2020-07-04 DIAGNOSIS — Z7982 Long term (current) use of aspirin: Secondary | ICD-10-CM | POA: Diagnosis not present

## 2020-07-04 DIAGNOSIS — Z8049 Family history of malignant neoplasm of other genital organs: Secondary | ICD-10-CM | POA: Diagnosis not present

## 2020-07-04 DIAGNOSIS — Z90710 Acquired absence of both cervix and uterus: Secondary | ICD-10-CM | POA: Diagnosis not present

## 2020-07-04 DIAGNOSIS — Z79899 Other long term (current) drug therapy: Secondary | ICD-10-CM | POA: Diagnosis not present

## 2020-07-04 DIAGNOSIS — K219 Gastro-esophageal reflux disease without esophagitis: Secondary | ICD-10-CM | POA: Diagnosis not present

## 2020-07-04 DIAGNOSIS — Z9079 Acquired absence of other genital organ(s): Secondary | ICD-10-CM | POA: Insufficient documentation

## 2020-07-04 DIAGNOSIS — E119 Type 2 diabetes mellitus without complications: Secondary | ICD-10-CM | POA: Insufficient documentation

## 2020-07-04 DIAGNOSIS — Z833 Family history of diabetes mellitus: Secondary | ICD-10-CM | POA: Insufficient documentation

## 2020-07-04 DIAGNOSIS — C541 Malignant neoplasm of endometrium: Secondary | ICD-10-CM

## 2020-07-04 NOTE — Patient Instructions (Signed)
Please notify Dr Denman George at phone number [phone] if you notice vaginal bleeding, new pelvic or abdominal pains, bloating, feeling full easy, or a change in bladder or bowel function.   Please return to see Dr Denman George in June, 2022 and Dr Harrington Challenger in December, 2022

## 2020-07-04 NOTE — Progress Notes (Signed)
Gynecologic Oncology Follow-up Note  Chief Complaint:  Chief Complaint  Patient presents with  . Endometrial cancer [facility])    Assessment/Plan:  [name]  is a 69 y.o.  A history of stage IA grade 1 endometrial adenocarcinoma s/p robotic staging on 06/15/19. MSI High with loss of MLH1. Declined genetics evaluation.  Pathology revealed low risk factors for recurrence, therefore no adjuvant therapy was recommended according to NCCN guidelines.  I discussed risk for recurrence and typical symptoms encouraged her to notify us of these should they develop between visits.  I recommend she have follow-up every 6 months for 5 years in accordance with NCCN guidelines. Those visits should include symptom assessment, physical exam and pelvic examination. Pap smears are not indicated or recommended in the routine surveillance of endometrial cancer.  She will see me in June every year and Dr Nicole Sampson in December each year.   HPI: [name] is a 69 year old P0 who is seen in consultation at the request of Dr Nicole Sampson for evaluation of endometrial cancer.  The patient reported having postmenopausal bleeding beginning in August 2020.  She had a scheduled gynecology visit with Dr. Harrington Sampson for October 2020 and decided she would bring it up at this time.  At her initial visit she had a Pap test that was normal.  She return for separate visit for further work-up of her bleeding.  At that visit a transvaginal ultrasound scan was performed which confirmed a thickened endometrial stripe this was performed on March 15, 2019.  An endometrial Pipelle biopsy was performed in the office which was benign.  She was then taken for hysteroscopy D&C procedure.  This was performed on May 11, 2019 and revealed grade 1 endometrial cancer.  On 06/15/19 she underwent a robotic assisted total hysterectomy with BSO and SLN biopsy. Intraoperative findings were unremarkable. Surgery was uncomplicated.  Final  pathology revealed a FIGO stage IA grade 1 endometrial cancer with 26m of 15 mm myo invasion, no LVSI, negative nodes, adnexa and cervix. Loss of MLH1 was noted on IHC (MSI high).  She was offered genetics consultation but declined.  No adjuvant therapy was recommended in accordance with NCCN guidelines due to low risk factors in the specimen.  Interval Hx:  Since surgery she has broken her right ankle and required surgery. She reported no symptoms concerning for recurrence.  Current Meds:  Outpatient Encounter Medications as of 07/04/2020  Medication Sig  . ascorbic acid (VITAMIN C) 500 MG tablet Take 500 mg by mouth daily.  .Marland Kitchenaspirin EC 81 MG tablet Take 81 mg by mouth every other day.   . Calcium Citrate-Vitamin D (CALCIUM + D PO) Take 1 tablet by mouth daily.   . hydrochlorothiazide (HYDRODIURIL) 12.5 MG tablet Take 12.5 mg by mouth daily.  .Marland Kitchenlosartan (COZAAR) 50 MG tablet Take 50 mg by mouth daily.  . metFORMIN (GLUCOPHAGE) 500 MG tablet Take 500 mg by mouth daily with breakfast.   . Multiple Vitamin (MULTIVITAMIN) tablet Take 1 tablet by mouth daily.  . Potassium 99 MG TABS Take 1 tablet by mouth daily as needed (for cramps).  . rosuvastatin (CRESTOR) 5 MG tablet Take 5 mg by mouth once a week.  . [DISCONTINUED] hydrochlorothiazide (MICROZIDE) 12.5 MG capsule Take 12.5 mg by mouth daily.   No facility-administered encounter medications on file as of 07/04/2020.    Allergy:  Allergies  Allergen Reactions  . Other Swelling and Other (See Comments)    "lime jello  causes swelling"    Social Hx:   Social History   Socioeconomic History  . Marital status: Single    Spouse name: Not on file  . Number of children: Not on file  . Years of education: Not on file  . Highest education level: Not on file  Occupational History  . Not on file  Tobacco Use  . Smoking status: Never Smoker  . Smokeless tobacco: Never Used  Vaping Use  . Vaping Use: Never used  Substance and  Sexual Activity  . Alcohol use: Never  . Drug use: Never  . Sexual activity: Not on file  Other Topics Concern  . Not on file  Social History Narrative  . Not on file   Social Determinants of Health   Financial Resource Strain: Not on file  Food Insecurity: Not on file  Transportation Needs: Not on file  Physical Activity: Not on file  Stress: Not on file  Social Connections: Not on file  Intimate Partner Violence: Not on file    Past Surgical Hx:  Past Surgical History:  Procedure Laterality Date  . BREAST MASS EXCISION Left 04/1996  . HYSTEROSCOPY WITH D & C N/A 05/11/2019   Procedure: DILATATION AND CURETTAGE /HYSTEROSCOPY;  Surgeon: Nicole Kick, MD;  Location: [place];  Service: Gynecology;  Laterality: N/A;  . ORIF ANKLE FRACTURE Right 07/06/2019   Procedure: OPEN REDUCTION INTERNAL FIXATION (ORIF) ANKLE FRACTURE;  Surgeon: Nicole Civil, MD;  Location: [facility];  Service: Orthopedics;  Laterality: Right;  . ROBOTIC ASSISTED TOTAL HYSTERECTOMY WITH BILATERAL SALPINGO OOPHERECTOMY Bilateral 06/15/2019   Procedure: XI ROBOTIC ASSISTED TOTAL HYSTERECTOMY GREATER THAN 250 GRAMS, WITH BILATERAL SALPINGO OOPHORECTOMY;  Surgeon: Nicole Amber, MD;  Location: [facility];  Service: Gynecology;  Laterality: Bilateral;  . SENTINEL NODE BIOPSY N/A 06/15/2019   Procedure: SENTINEL NODE BIOPSY;  Surgeon: Nicole Amber, MD;  Location: [facility];  Service: Gynecology;  Laterality: N/A;    Past Medical Hx:  Past Medical History:  Diagnosis Date  . Arthritis    knees  . Full dentures   . GERD (gastroesophageal reflux disease)   . Hypertension    followed by pcp  (05-10-2019  per pt never had a stress test)  . PMB (postmenopausal bleeding)   . Type 2 diabetes mellitus ([place])    followed by pcp  (05-10-2019 check's cbg twice weekly,  fasting cbg-- 103)  . Wears glasses     Past Gynecological History:  G0 No LMP recorded. Patient is postmenopausal.  Family Hx:  Family  History  Problem Relation Age of Onset  . Uterine cancer Mother   . Diabetes Mother   . Hypertension Mother   . Diabetes Father   . Hypertension Brother     Review of Systems:  Constitutional  Feels well,    ENT Normal appearing ears and nares bilaterally Skin/Breast  No rash, sores, jaundice, itching, dryness Cardiovascular  No chest pain, shortness of breath, or edema  Pulmonary  No cough or wheeze.  Gastro Intestinal  No nausea, vomitting, or diarrhoea. No bright red blood per rectum, no abdominal pain, change in bowel movement, or constipation.  Genito Urinary  No frequency, urgency, dysuria, no vaginal bleeding Musculo Skeletal  No myalgia, arthralgia, joint swelling or pain  Neurologic  No weakness, numbness, change in gait,  Psychology  No depression, anxiety, insomnia.   Vitals:  Blood pressure (!) 148/59, pulse 78, temperature (!) 97.2 F (36.2 C), temperature source Tympanic, resp. rate 18,  height _0  (1.727 m), weight 247 lb 9.6 oz (112.3 kg), SpO2 100 %.  Physical Exam: WD in NAD Neck  Supple NROM, without any enlargements.  Lymph Node Survey No cervical supraclavicular or inguinal adenopathy Cardiovascular  Pulse normal rate, regularity and rhythm. S1 and S2 normal.  Lungs  Clear to auscultation bilateraly, without wheezes/crackles/rhonchi. Good air movement.  Skin  No rash/lesions/breakdown  Psychiatry  Alert and oriented to person, place, and time  Abdomen  Normoactive bowel sounds, abdomen soft, non-tender and obese without evidence of hernia. Well healed incisions Back No CVA tenderness Genito Urinary  Vulva/vagina: Normal external female genitalia.  No lesions. No discharge or bleeding.  Bladder/urethra:  No lesions or masses, well supported bladder  Vagina: vaginal cuff in tact, no bleeding, no lesions, no palpable masses  Cervix and uterus surgically absent  Adnexa: no palpable masses. Rectal  deferred Extremities  No bilateral  cyanosis, clubbing or edema.   Thereasa Solo, MD  07/04/2020, 2:52 PM

## 2020-07-12 DIAGNOSIS — E2839 Other primary ovarian failure: Secondary | ICD-10-CM | POA: Diagnosis not present

## 2020-07-12 DIAGNOSIS — I10 Essential (primary) hypertension: Secondary | ICD-10-CM | POA: Diagnosis not present

## 2020-08-05 DIAGNOSIS — E1122 Type 2 diabetes mellitus with diabetic chronic kidney disease: Secondary | ICD-10-CM | POA: Diagnosis not present

## 2020-08-05 DIAGNOSIS — E1165 Type 2 diabetes mellitus with hyperglycemia: Secondary | ICD-10-CM | POA: Diagnosis not present

## 2020-08-05 DIAGNOSIS — I10 Essential (primary) hypertension: Secondary | ICD-10-CM | POA: Diagnosis not present

## 2020-08-05 DIAGNOSIS — N183 Chronic kidney disease, stage 3 unspecified: Secondary | ICD-10-CM | POA: Diagnosis not present

## 2020-08-05 DIAGNOSIS — Z299 Encounter for prophylactic measures, unspecified: Secondary | ICD-10-CM | POA: Diagnosis not present

## 2020-11-08 DIAGNOSIS — Z23 Encounter for immunization: Secondary | ICD-10-CM | POA: Diagnosis not present

## 2020-12-11 ENCOUNTER — Encounter: Payer: Self-pay | Admitting: Gynecologic Oncology

## 2020-12-13 ENCOUNTER — Other Ambulatory Visit: Payer: Self-pay

## 2020-12-13 ENCOUNTER — Encounter: Payer: Self-pay | Admitting: Gynecologic Oncology

## 2020-12-13 ENCOUNTER — Inpatient Hospital Stay: Payer: Medicare Other | Attending: Gynecologic Oncology | Admitting: Gynecologic Oncology

## 2020-12-13 VITALS — BP 146/55 | HR 82 | Temp 98.4°F | Resp 18 | Ht 68.0 in | Wt 252.2 lb

## 2020-12-13 DIAGNOSIS — Z90722 Acquired absence of ovaries, bilateral: Secondary | ICD-10-CM | POA: Diagnosis not present

## 2020-12-13 DIAGNOSIS — E119 Type 2 diabetes mellitus without complications: Secondary | ICD-10-CM | POA: Diagnosis not present

## 2020-12-13 DIAGNOSIS — I10 Essential (primary) hypertension: Secondary | ICD-10-CM | POA: Diagnosis not present

## 2020-12-13 DIAGNOSIS — Z79899 Other long term (current) drug therapy: Secondary | ICD-10-CM | POA: Diagnosis not present

## 2020-12-13 DIAGNOSIS — Z7984 Long term (current) use of oral hypoglycemic drugs: Secondary | ICD-10-CM | POA: Insufficient documentation

## 2020-12-13 DIAGNOSIS — C541 Malignant neoplasm of endometrium: Secondary | ICD-10-CM | POA: Diagnosis not present

## 2020-12-13 DIAGNOSIS — Z90710 Acquired absence of both cervix and uterus: Secondary | ICD-10-CM | POA: Insufficient documentation

## 2020-12-13 DIAGNOSIS — K219 Gastro-esophageal reflux disease without esophagitis: Secondary | ICD-10-CM | POA: Diagnosis not present

## 2020-12-13 NOTE — Patient Instructions (Signed)
Please notify Dr Denman George at phone number [phone] if you notice vaginal bleeding, new pelvic or abdominal pains, bloating, feeling full easy, or a change in bladder or bowel function.   Please contact Dr Serita Grit office (at [phone]) in the fall after your appointment with Dr Harrington Challenger to request an appointment with her for June, 2023.

## 2020-12-13 NOTE — Progress Notes (Signed)
Gynecologic Oncology Follow-up Note  Chief Complaint:  Chief Complaint  Patient presents with  . Endometrial cancer [facility])    Assessment/Plan:  [name]  is a 70 y.o.  A history of stage IA grade 1 endometrial adenocarcinoma s/p robotic staging on 06/15/19. MSI High with loss of MLH1. Declined genetics evaluation.  Pathology revealed low risk factors for recurrence, therefore no adjuvant therapy was recommended according to NCCN guidelines.  I discussed risk for recurrence and typical symptoms encouraged her to notify us of these should they develop between visits.  I recommend she have follow-up every 6 months for 5 years in accordance with NCCN guidelines. Those visits should include symptom assessment, physical exam and pelvic examination. Pap smears are not indicated or recommended in the routine surveillance of endometrial cancer.  She will see me in June every year and Nicole Sampson in December each year.   HPI: [name] is a 70 year old P0 who is seen in consultation at the request of Nicole Nicole Sampson for evaluation of endometrial cancer.  The patient reported having postmenopausal bleeding beginning in August 2020.  She had a scheduled gynecology visit with Nicole. Harrington Sampson for October 2020 and decided she would bring it up at this time.  At her initial visit she had a Pap test that was normal.  She return for separate visit for further work-up of her bleeding.  At that visit a transvaginal ultrasound scan was performed which confirmed a thickened endometrial stripe this was performed on March 15, 2019.  An endometrial Pipelle biopsy was performed in the office which was benign.  She was then taken for hysteroscopy D&C procedure.  This was performed on May 11, 2019 and revealed grade 1 endometrial cancer.  On 06/15/19 she underwent a robotic assisted total hysterectomy with BSO and SLN biopsy. Intraoperative findings were unremarkable. Surgery was uncomplicated.  Final  pathology revealed a FIGO stage IA grade 1 endometrial cancer with 64m of 15 mm myo invasion, no LVSI, negative nodes, adnexa and cervix. Loss of MLH1 was noted on IHC (MSI high).  She was offered genetics consultation but declined.  No adjuvant therapy was recommended in accordance with NCCN guidelines due to low risk factors in the specimen.  Interval Hx:  She reported no symptoms concerning for recurrence.  Current Meds:  Outpatient Encounter Medications as of 12/13/2020  Medication Sig  . ascorbic acid (VITAMIN C) 500 MG tablet Take 500 mg by mouth daily.  .Marland Kitchenaspirin EC 81 MG tablet Take 81 mg by mouth every other day.   . Calcium Citrate-Vitamin D (CALCIUM + D PO) Take 1 tablet by mouth daily.   . hydrochlorothiazide (HYDRODIURIL) 12.5 MG tablet Take 12.5 mg by mouth daily.  .Marland Kitchenlosartan (COZAAR) 50 MG tablet Take 50 mg by mouth daily.  . metFORMIN (GLUCOPHAGE) 500 MG tablet Take 500 mg by mouth daily with breakfast.   . Multiple Vitamin (MULTIVITAMIN) tablet Take 1 tablet by mouth daily.  . rosuvastatin (CRESTOR) 5 MG tablet Take 5 mg by mouth once a week.  . Potassium 99 MG TABS Take 1 tablet by mouth daily as needed (for cramps). (Patient not taking: Reported on 12/11/2020)   No facility-administered encounter medications on file as of 12/13/2020.    Allergy:  Allergies  Allergen Reactions  . Other Swelling and Other (See Comments)    "lime jello causes swelling"    Social Hx:   Social History   Socioeconomic History  . Marital status:  Single    Spouse name: Not on file  . Number of children: Not on file  . Years of education: Not on file  . Highest education level: Not on file  Occupational History  . Not on file  Tobacco Use  . Smoking status: Never Smoker  . Smokeless tobacco: Never Used  Vaping Use  . Vaping Use: Never used  Substance and Sexual Activity  . Alcohol use: Never  . Drug use: Never  . Sexual activity: Not on file  Other Topics Concern  . Not on  file  Social History Narrative  . Not on file   Social Determinants of Health   Financial Resource Strain: Not on file  Food Insecurity: Not on file  Transportation Needs: Not on file  Physical Activity: Not on file  Stress: Not on file  Social Connections: Not on file  Intimate Partner Violence: Not on file    Past Surgical Hx:  Past Surgical History:  Procedure Laterality Date  . BREAST MASS EXCISION Left 04/1996  . HYSTEROSCOPY WITH D & C N/A 05/11/2019   Procedure: DILATATION AND CURETTAGE /HYSTEROSCOPY;  Surgeon: Nicole Kick, MD;  Location: [place];  Service: Gynecology;  Laterality: N/A;  . ORIF ANKLE FRACTURE Right 07/06/2019   Procedure: OPEN REDUCTION INTERNAL FIXATION (ORIF) ANKLE FRACTURE;  Surgeon: Carole Civil, MD;  Location: [facility];  Service: Orthopedics;  Laterality: Right;  . ROBOTIC ASSISTED TOTAL HYSTERECTOMY WITH BILATERAL SALPINGO OOPHERECTOMY Bilateral 06/15/2019   Procedure: XI ROBOTIC ASSISTED TOTAL HYSTERECTOMY GREATER THAN 250 GRAMS, WITH BILATERAL SALPINGO OOPHORECTOMY;  Surgeon: Everitt Amber, MD;  [facility];  Service: Gynecology;  Laterality: Bilateral;  . SENTINEL NODE BIOPSY N/A 06/15/2019   Procedure: SENTINEL NODE BIOPSY;  Surgeon: Everitt Amber, MD;  Location[facility];  Service: Gynecology;  Laterality: N/A;    Past Medical Hx:  Past Medical History:  Diagnosis Date  . Arthritis    knees  . Full dentures   . GERD (gastroesophageal reflux disease)   . Hypertension    followed by pcp  (05-10-2019  per pt never had a stress test)  . PMB (postmenopausal bleeding)   . Type 2 diabetes mellitus ([place])    followed by pcp  (05-10-2019 check's cbg twice weekly,  fasting cbg-- 103)  . Wears glasses     Past Gynecological History:  G0 No LMP recorded. Patient is postmenopausal.  Family Hx:  Family History  Problem Relation Age of Onset  . Uterine cancer Mother   . Diabetes Mother   . Hypertension Mother   . Diabetes  Father   . Hypertension Brother     Review of Systems:  Constitutional  Feels well,    ENT Normal appearing ears and nares bilaterally Skin/Breast  No rash, sores, jaundice, itching, dryness Cardiovascular  No chest pain, shortness of breath, or edema  Pulmonary  No cough or wheeze.  Gastro Intestinal  No nausea, vomitting, or diarrhoea. No bright red blood per rectum, no abdominal pain, change in bowel movement, or constipation.  Genito Urinary  No frequency, urgency, dysuria, no vaginal bleeding Musculo Skeletal  No myalgia, arthralgia, joint swelling or pain  Neurologic  No weakness, numbness, change in gait,  Psychology  No depression, anxiety, insomnia.   Vitals:  Blood pressure (!) 146/55, pulse 82, temperature 98.4 F (36.9 C), temperature source Oral, resp. rate 18, height '5\' 8"'  (1.727 m), weight 252 lb 3.2 oz (114.4 kg), SpO2 100 %.  Physical Exam: WD in  NAD Neck  Supple NROM, without any enlargements.  Lymph Node Survey No cervical supraclavicular or inguinal adenopathy Cardiovascular  Well perfused peripheries Lungs  No increased WOB Skin  No rash/lesions/breakdown  Psychiatry  Alert and oriented to person, place, and time  Abdomen  Normoactive bowel sounds, abdomen soft, non-tender and obese without evidence of hernia. Well healed incisions Back No CVA tenderness Genito Urinary  Vulva/vagina: Normal external female genitalia.  No lesions. No discharge or bleeding.  Bladder/urethra:  No lesions or masses, well supported bladder  Vagina: vaginal cuff in tact, no bleeding, no lesions, no palpable masses  Cervix and uterus surgically absent  Adnexa: no palpable masses. Rectal  deferred Extremities  No bilateral cyanosis, clubbing or edema.   Thereasa Solo, MD  12/13/2020, 1:40 PM

## 2020-12-16 DIAGNOSIS — E1165 Type 2 diabetes mellitus with hyperglycemia: Secondary | ICD-10-CM | POA: Diagnosis not present

## 2020-12-16 DIAGNOSIS — N183 Chronic kidney disease, stage 3 unspecified: Secondary | ICD-10-CM | POA: Diagnosis not present

## 2020-12-16 DIAGNOSIS — I10 Essential (primary) hypertension: Secondary | ICD-10-CM | POA: Diagnosis not present

## 2020-12-16 DIAGNOSIS — Z299 Encounter for prophylactic measures, unspecified: Secondary | ICD-10-CM | POA: Diagnosis not present

## 2020-12-16 DIAGNOSIS — C541 Malignant neoplasm of endometrium: Secondary | ICD-10-CM | POA: Diagnosis not present

## 2020-12-16 DIAGNOSIS — E1122 Type 2 diabetes mellitus with diabetic chronic kidney disease: Secondary | ICD-10-CM | POA: Diagnosis not present

## 2021-01-28 DIAGNOSIS — Z79899 Other long term (current) drug therapy: Secondary | ICD-10-CM | POA: Diagnosis not present

## 2021-01-28 DIAGNOSIS — I10 Essential (primary) hypertension: Secondary | ICD-10-CM | POA: Diagnosis not present

## 2021-01-28 DIAGNOSIS — E559 Vitamin D deficiency, unspecified: Secondary | ICD-10-CM | POA: Diagnosis not present

## 2021-01-28 DIAGNOSIS — E7800 Pure hypercholesterolemia, unspecified: Secondary | ICD-10-CM | POA: Diagnosis not present

## 2021-01-28 DIAGNOSIS — Z6841 Body Mass Index (BMI) 40.0 and over, adult: Secondary | ICD-10-CM | POA: Diagnosis not present

## 2021-01-28 DIAGNOSIS — Z1331 Encounter for screening for depression: Secondary | ICD-10-CM | POA: Diagnosis not present

## 2021-01-28 DIAGNOSIS — E1165 Type 2 diabetes mellitus with hyperglycemia: Secondary | ICD-10-CM | POA: Diagnosis not present

## 2021-01-28 DIAGNOSIS — Z299 Encounter for prophylactic measures, unspecified: Secondary | ICD-10-CM | POA: Diagnosis not present

## 2021-01-28 DIAGNOSIS — Z1211 Encounter for screening for malignant neoplasm of colon: Secondary | ICD-10-CM | POA: Diagnosis not present

## 2021-01-28 DIAGNOSIS — Z1339 Encounter for screening examination for other mental health and behavioral disorders: Secondary | ICD-10-CM | POA: Diagnosis not present

## 2021-01-28 DIAGNOSIS — Z789 Other specified health status: Secondary | ICD-10-CM | POA: Diagnosis not present

## 2021-01-28 DIAGNOSIS — R5383 Other fatigue: Secondary | ICD-10-CM | POA: Diagnosis not present

## 2021-01-28 DIAGNOSIS — Z0000 Encounter for general adult medical examination without abnormal findings: Secondary | ICD-10-CM | POA: Diagnosis not present

## 2021-01-28 DIAGNOSIS — Z7189 Other specified counseling: Secondary | ICD-10-CM | POA: Diagnosis not present

## 2021-03-26 DIAGNOSIS — H35033 Hypertensive retinopathy, bilateral: Secondary | ICD-10-CM | POA: Diagnosis not present

## 2021-03-26 DIAGNOSIS — H52221 Regular astigmatism, right eye: Secondary | ICD-10-CM | POA: Diagnosis not present

## 2021-03-26 DIAGNOSIS — H5212 Myopia, left eye: Secondary | ICD-10-CM | POA: Diagnosis not present

## 2021-03-26 DIAGNOSIS — H2513 Age-related nuclear cataract, bilateral: Secondary | ICD-10-CM | POA: Diagnosis not present

## 2021-03-26 DIAGNOSIS — H52222 Regular astigmatism, left eye: Secondary | ICD-10-CM | POA: Diagnosis not present

## 2021-03-26 DIAGNOSIS — H524 Presbyopia: Secondary | ICD-10-CM | POA: Diagnosis not present

## 2021-03-26 DIAGNOSIS — I10 Essential (primary) hypertension: Secondary | ICD-10-CM | POA: Diagnosis not present

## 2021-03-26 DIAGNOSIS — Z7984 Long term (current) use of oral hypoglycemic drugs: Secondary | ICD-10-CM | POA: Diagnosis not present

## 2021-03-26 DIAGNOSIS — E119 Type 2 diabetes mellitus without complications: Secondary | ICD-10-CM | POA: Diagnosis not present

## 2021-04-08 DIAGNOSIS — Z23 Encounter for immunization: Secondary | ICD-10-CM | POA: Diagnosis not present

## 2021-04-08 DIAGNOSIS — Z20828 Contact with and (suspected) exposure to other viral communicable diseases: Secondary | ICD-10-CM | POA: Diagnosis not present

## 2021-04-21 DIAGNOSIS — I10 Essential (primary) hypertension: Secondary | ICD-10-CM | POA: Diagnosis not present

## 2021-04-21 DIAGNOSIS — N183 Chronic kidney disease, stage 3 unspecified: Secondary | ICD-10-CM | POA: Diagnosis not present

## 2021-04-21 DIAGNOSIS — E1165 Type 2 diabetes mellitus with hyperglycemia: Secondary | ICD-10-CM | POA: Diagnosis not present

## 2021-04-21 DIAGNOSIS — Z299 Encounter for prophylactic measures, unspecified: Secondary | ICD-10-CM | POA: Diagnosis not present

## 2021-04-21 DIAGNOSIS — E1122 Type 2 diabetes mellitus with diabetic chronic kidney disease: Secondary | ICD-10-CM | POA: Diagnosis not present

## 2021-04-23 DIAGNOSIS — Z20828 Contact with and (suspected) exposure to other viral communicable diseases: Secondary | ICD-10-CM | POA: Diagnosis not present

## 2021-04-23 DIAGNOSIS — Z23 Encounter for immunization: Secondary | ICD-10-CM | POA: Diagnosis not present

## 2021-06-26 DIAGNOSIS — Z6841 Body Mass Index (BMI) 40.0 and over, adult: Secondary | ICD-10-CM | POA: Diagnosis not present

## 2021-06-26 DIAGNOSIS — Z01419 Encounter for gynecological examination (general) (routine) without abnormal findings: Secondary | ICD-10-CM | POA: Diagnosis not present

## 2021-07-01 DIAGNOSIS — C541 Malignant neoplasm of endometrium: Secondary | ICD-10-CM | POA: Insufficient documentation

## 2021-07-03 DIAGNOSIS — R509 Fever, unspecified: Secondary | ICD-10-CM | POA: Diagnosis not present

## 2021-07-03 DIAGNOSIS — Z299 Encounter for prophylactic measures, unspecified: Secondary | ICD-10-CM | POA: Diagnosis not present

## 2021-07-03 DIAGNOSIS — U071 COVID-19: Secondary | ICD-10-CM | POA: Diagnosis not present

## 2021-07-03 DIAGNOSIS — R0981 Nasal congestion: Secondary | ICD-10-CM | POA: Diagnosis not present

## 2021-07-03 DIAGNOSIS — Z6841 Body Mass Index (BMI) 40.0 and over, adult: Secondary | ICD-10-CM | POA: Diagnosis not present

## 2021-07-22 ENCOUNTER — Encounter (HOSPITAL_COMMUNITY): Payer: Self-pay | Admitting: *Deleted

## 2021-07-22 ENCOUNTER — Emergency Department (HOSPITAL_COMMUNITY): Payer: Medicare Other

## 2021-07-22 ENCOUNTER — Inpatient Hospital Stay (HOSPITAL_COMMUNITY)
Admission: EM | Admit: 2021-07-22 | Discharge: 2021-07-24 | DRG: 563 | Disposition: A | Discharge status: Home Health | Payer: Medicare Other | Attending: Internal Medicine | Admitting: Internal Medicine

## 2021-07-22 DIAGNOSIS — Z8542 Personal history of malignant neoplasm of other parts of uterus: Secondary | ICD-10-CM

## 2021-07-22 DIAGNOSIS — K219 Gastro-esophageal reflux disease without esophagitis: Secondary | ICD-10-CM | POA: Diagnosis present

## 2021-07-22 DIAGNOSIS — Z90722 Acquired absence of ovaries, bilateral: Secondary | ICD-10-CM

## 2021-07-22 DIAGNOSIS — Z20822 Contact with and (suspected) exposure to covid-19: Secondary | ICD-10-CM | POA: Diagnosis present

## 2021-07-22 DIAGNOSIS — M79604 Pain in right leg: Secondary | ICD-10-CM | POA: Diagnosis not present

## 2021-07-22 DIAGNOSIS — Z8616 Personal history of COVID-19: Secondary | ICD-10-CM

## 2021-07-22 DIAGNOSIS — N1832 Chronic kidney disease, stage 3b: Secondary | ICD-10-CM | POA: Diagnosis present

## 2021-07-22 DIAGNOSIS — Z8249 Family history of ischemic heart disease and other diseases of the circulatory system: Secondary | ICD-10-CM | POA: Diagnosis not present

## 2021-07-22 DIAGNOSIS — E785 Hyperlipidemia, unspecified: Secondary | ICD-10-CM

## 2021-07-22 DIAGNOSIS — E1122 Type 2 diabetes mellitus with diabetic chronic kidney disease: Secondary | ICD-10-CM | POA: Diagnosis present

## 2021-07-22 DIAGNOSIS — S82201A Unspecified fracture of shaft of right tibia, initial encounter for closed fracture: Secondary | ICD-10-CM | POA: Diagnosis not present

## 2021-07-22 DIAGNOSIS — Z7984 Long term (current) use of oral hypoglycemic drugs: Secondary | ICD-10-CM | POA: Diagnosis not present

## 2021-07-22 DIAGNOSIS — Z90710 Acquired absence of both cervix and uterus: Secondary | ICD-10-CM

## 2021-07-22 DIAGNOSIS — E119 Type 2 diabetes mellitus without complications: Secondary | ICD-10-CM | POA: Diagnosis not present

## 2021-07-22 DIAGNOSIS — Y92512 Supermarket, store or market as the place of occurrence of the external cause: Secondary | ICD-10-CM | POA: Diagnosis not present

## 2021-07-22 DIAGNOSIS — S82231A Displaced oblique fracture of shaft of right tibia, initial encounter for closed fracture: Secondary | ICD-10-CM | POA: Diagnosis present

## 2021-07-22 DIAGNOSIS — S82309A Unspecified fracture of lower end of unspecified tibia, initial encounter for closed fracture: Secondary | ICD-10-CM

## 2021-07-22 DIAGNOSIS — Z8049 Family history of malignant neoplasm of other genital organs: Secondary | ICD-10-CM | POA: Diagnosis not present

## 2021-07-22 DIAGNOSIS — Z6841 Body Mass Index (BMI) 40.0 and over, adult: Secondary | ICD-10-CM | POA: Diagnosis not present

## 2021-07-22 DIAGNOSIS — M170 Bilateral primary osteoarthritis of knee: Secondary | ICD-10-CM | POA: Diagnosis present

## 2021-07-22 DIAGNOSIS — Z79899 Other long term (current) drug therapy: Secondary | ICD-10-CM | POA: Diagnosis not present

## 2021-07-22 DIAGNOSIS — E66813 Obesity, class 3: Secondary | ICD-10-CM

## 2021-07-22 DIAGNOSIS — Z7982 Long term (current) use of aspirin: Secondary | ICD-10-CM

## 2021-07-22 DIAGNOSIS — W010XXA Fall on same level from slipping, tripping and stumbling without subsequent striking against object, initial encounter: Secondary | ICD-10-CM | POA: Diagnosis present

## 2021-07-22 DIAGNOSIS — Z01818 Encounter for other preprocedural examination: Secondary | ICD-10-CM | POA: Diagnosis not present

## 2021-07-22 DIAGNOSIS — U071 COVID-19: Secondary | ICD-10-CM

## 2021-07-22 DIAGNOSIS — I10 Essential (primary) hypertension: Secondary | ICD-10-CM | POA: Diagnosis present

## 2021-07-22 DIAGNOSIS — I129 Hypertensive chronic kidney disease with stage 1 through stage 4 chronic kidney disease, or unspecified chronic kidney disease: Secondary | ICD-10-CM | POA: Diagnosis present

## 2021-07-22 DIAGNOSIS — S82301A Unspecified fracture of lower end of right tibia, initial encounter for closed fracture: Secondary | ICD-10-CM | POA: Diagnosis not present

## 2021-07-22 DIAGNOSIS — Z01811 Encounter for preprocedural respiratory examination: Secondary | ICD-10-CM

## 2021-07-22 DIAGNOSIS — Z833 Family history of diabetes mellitus: Secondary | ICD-10-CM

## 2021-07-22 DIAGNOSIS — S82209A Unspecified fracture of shaft of unspecified tibia, initial encounter for closed fracture: Principal | ICD-10-CM | POA: Diagnosis present

## 2021-07-22 LAB — CBC
HCT: 31.5 % — ABNORMAL LOW (ref 36.0–46.0)
Hemoglobin: 10.5 g/dL — ABNORMAL LOW (ref 12.0–15.0)
MCH: 29.7 pg (ref 26.0–34.0)
MCHC: 33.3 g/dL (ref 30.0–36.0)
MCV: 89 fL (ref 80.0–100.0)
Platelets: 216 K/uL (ref 150–400)
RBC: 3.54 MIL/uL — ABNORMAL LOW (ref 3.87–5.11)
RDW: 14.2 % (ref 11.5–15.5)
WBC: 9.3 K/uL (ref 4.0–10.5)
nRBC: 0 % (ref 0.0–0.2)

## 2021-07-22 LAB — COMPREHENSIVE METABOLIC PANEL
ALT: 10 U/L (ref 0–44)
AST: 16 U/L (ref 15–41)
Albumin: 3.6 g/dL (ref 3.5–5.0)
Alkaline Phosphatase: 46 U/L (ref 38–126)
Anion gap: 7 (ref 5–15)
BUN: 47 mg/dL — ABNORMAL HIGH (ref 8–23)
CO2: 23 mmol/L (ref 22–32)
Calcium: 9.4 mg/dL (ref 8.9–10.3)
Chloride: 106 mmol/L (ref 98–111)
Creatinine, Ser: 1.8 mg/dL — ABNORMAL HIGH (ref 0.44–1.00)
GFR, Estimated: 30 mL/min — ABNORMAL LOW (ref >60)
Glucose, Bld: 164 mg/dL — ABNORMAL HIGH (ref 70–99)
Potassium: 4.8 mmol/L (ref 3.5–5.1)
Sodium: 136 mmol/L (ref 135–145)
Total Bilirubin: 0.2 mg/dL — ABNORMAL LOW (ref 0.3–1.2)
Total Protein: 7.4 g/dL (ref 6.5–8.1)

## 2021-07-22 LAB — RESP PANEL BY RT-PCR (FLU A&B, COVID) ARPGX2
Influenza A by PCR: NEGATIVE
Influenza B by PCR: NEGATIVE
SARS Coronavirus 2 by RT PCR: POSITIVE — AB

## 2021-07-22 LAB — PROTIME-INR
INR: 1 (ref 0.8–1.2)
Prothrombin Time: 13 s (ref 11.4–15.2)

## 2021-07-22 MED ORDER — ASPIRIN EC 81 MG PO TBEC
81.0000 mg | DELAYED_RELEASE_TABLET | ORAL | Status: DC
  Administered 2021-07-24: 81 mg via ORAL
  Filled 2021-07-22: qty 1

## 2021-07-22 MED ORDER — ACETAMINOPHEN 650 MG RE SUPP: 650.0000 mg | Freq: Four times a day (QID) | RECTAL | Status: DC | PRN

## 2021-07-22 MED ORDER — ONDANSETRON HCL 4 MG/2ML IJ SOLN: 4.0000 mg | Freq: Four times a day (QID) | INTRAMUSCULAR | Status: DC | PRN

## 2021-07-22 MED ORDER — OXYCODONE HCL 5 MG PO TABS: 5.0000 mg | ORAL_TABLET | ORAL | Status: DC | PRN

## 2021-07-22 MED ORDER — LOSARTAN POTASSIUM 50 MG PO TABS: 50.0000 mg | ORAL_TABLET | Freq: Every day | ORAL | Status: DC

## 2021-07-22 MED ORDER — HEPARIN SODIUM (PORCINE) 5000 UNIT/ML IJ SOLN
5000.0000 [IU] | Freq: Three times a day (TID) | INTRAMUSCULAR | Status: DC
  Administered 2021-07-23 – 2021-07-24 (×4): 5000 [IU] via SUBCUTANEOUS
  Filled 2021-07-22 (×4): qty 1

## 2021-07-22 MED ORDER — MORPHINE SULFATE (PF) 2 MG/ML IV SOLN: 2.0000 mg | INTRAVENOUS | Status: DC | PRN

## 2021-07-22 MED ORDER — ROSUVASTATIN CALCIUM 10 MG PO TABS: 5.0000 mg | ORAL_TABLET | ORAL | Status: DC

## 2021-07-22 MED ORDER — ONDANSETRON HCL 4 MG PO TABS: 4.0000 mg | ORAL_TABLET | Freq: Four times a day (QID) | ORAL | Status: DC | PRN

## 2021-07-22 MED ORDER — HYDROCHLOROTHIAZIDE 25 MG PO TABS: 12.5000 mg | ORAL_TABLET | Freq: Every day | ORAL | Status: DC

## 2021-07-22 MED ORDER — ACETAMINOPHEN 325 MG PO TABS: 650.0000 mg | ORAL_TABLET | Freq: Four times a day (QID) | ORAL | Status: DC | PRN

## 2021-07-22 NOTE — ED Triage Notes (Signed)
Pain in right lower leg and knee after a fall today

## 2021-07-22 NOTE — ED Provider Notes (Signed)
[facility] EMERGENCY DEPARTMENT Provider Note   CSN: [id number] Arrival date & time: 07/22/21  1519     History  Chief Complaint  Patient presents with   Lytle Michaels    TAMEIA RAFFERTY is a 71 y.o. female.   Fall Pertinent negatives include no chest pain, no abdominal pain, no headaches and no shortness of breath. Patient presents after ground-level fall.  Fall occurred approximately 2 PM.  She was at the store and her right leg slipped out from under her.  When she fell, right leg was bent underneath her when she landed.  She had pain to the area of her mid to distal tibia.  She was unable to bear weight on her right leg.  For this reason, she presented to the ED.  Patient underwent ORIF of her right ankle 2 years ago.  Currently, while resting, she is not in any significant pain.  Pain only occurs with weightbearing.  Patient last ate around noon.  She is not on any blood thinning medications other than a daily aspirin.  She denies any other areas of pain or suspected injury from the fall.     Home Medications Prior to Admission medications   Medication Sig Start Date End Date Taking? Authorizing Provider  ascorbic acid (VITAMIN C) 500 MG tablet Take 500 mg by mouth daily.   Yes [provider]  aspirin EC 81 MG tablet Take 81 mg by mouth every other day.    Yes [provider]  Calcium Citrate-Vitamin D (CALCIUM + D PO) Take 1 tablet by mouth daily.    Yes [provider]  hydrochlorothiazide (HYDRODIURIL) 12.5 MG tablet Take 12.5 mg by mouth daily. 02/23/20  Yes [provider]  losartan (COZAAR) 50 MG tablet Take 50 mg by mouth daily.   Yes [provider]  metFORMIN (GLUCOPHAGE) 500 MG tablet Take 500 mg by mouth daily with breakfast.    Yes [provider]  Multiple Vitamin (MULTIVITAMIN) tablet Take 1 tablet by mouth daily.   Yes [provider]  rosuvastatin (CRESTOR) 5 MG tablet Take 5 mg by mouth once a week. 12/08/19   Yes [provider]      Allergies    Other    Review of Systems   Review of Systems  Constitutional:  Negative for chills, fatigue and fever.  HENT:  Negative for ear pain and sore throat.   Eyes:  Negative for pain and visual disturbance.  Respiratory:  Negative for cough and shortness of breath.   Cardiovascular:  Negative for chest pain and palpitations.  Gastrointestinal:  Negative for abdominal pain, diarrhea, nausea and vomiting.  Genitourinary:  Negative for dysuria, flank pain and hematuria.  Musculoskeletal:  Positive for gait problem. Negative for arthralgias, back pain, myalgias, neck pain and neck stiffness.  Skin:  Negative for color change and rash.  Neurological:  Negative for dizziness, seizures, syncope, weakness, light-headedness, numbness and headaches.  Hematological:  Does not bruise/bleed easily.  Psychiatric/Behavioral:  Negative for confusion and decreased concentration.   All other systems reviewed and are negative.  Physical Exam Updated Vital Signs BP (!) 142/72 (BP Location: Right Arm)    Pulse 91    Temp 98.5 F (36.9 C) (Oral)    Resp 18    Ht 5\' 8"  (1.727 m)    Wt 120 kg    SpO2 100%    BMI 40.22 kg/m  Physical Exam Vitals and nursing note reviewed.  Constitutional:  General: She is not in acute distress.    Appearance: Normal appearance. She is well-developed and normal weight. She is not ill-appearing, toxic-appearing or diaphoretic.  HENT:     Head: Normocephalic and atraumatic.     Right Ear: External ear normal.     Left Ear: External ear normal.     Nose: Nose normal.     Mouth/Throat:     Mouth: Mucous membranes are moist.     Pharynx: Oropharynx is clear.  Eyes:     General: No scleral icterus.    Extraocular Movements: Extraocular movements intact.     Conjunctiva/sclera: Conjunctivae normal.  Cardiovascular:     Rate and Rhythm: Normal rate and regular rhythm.     Heart sounds: No murmur heard. Pulmonary:      Effort: Pulmonary effort is normal. No respiratory distress.  Chest:     Chest wall: No tenderness.  Abdominal:     Palpations: Abdomen is soft.     Tenderness: There is no abdominal tenderness.  Musculoskeletal:        General: Tenderness and signs of injury present. No swelling or deformity.     Cervical back: Normal range of motion and neck supple. No rigidity.     Right lower leg: No edema.     Left lower leg: No edema.  Skin:    General: Skin is warm and dry.     Capillary Refill: Capillary refill takes less than 2 seconds.     Coloration: Skin is not jaundiced or pale.  Neurological:     General: No focal deficit present.     Mental Status: She is alert and oriented to person, place, and time.     Cranial Nerves: No cranial nerve deficit.     Sensory: No sensory deficit.     Motor: No weakness.  Psychiatric:        Mood and Affect: Mood normal.        Behavior: Behavior normal.        Thought Content: Thought content normal.        Judgment: Judgment normal.    ED Results / Procedures / Treatments   Labs (all labs ordered are listed, but only abnormal results are displayed) Labs Reviewed  RESP PANEL BY RT-PCR (FLU A&B, COVID) ARPGX2 - Abnormal; Notable for the following components:      Result Value   SARS Coronavirus 2 by RT PCR POSITIVE (*)    All other components within normal limits  COMPREHENSIVE METABOLIC PANEL - Abnormal; Notable for the following components:   Glucose, Bld 164 (*)    BUN 47 (*)    Creatinine, Ser 1.80 (*)    Total Bilirubin 0.2 (*)    GFR, Estimated 30 (*)    All other components within normal limits  CBC - Abnormal; Notable for the following components:   RBC 3.54 (*)    Hemoglobin 10.5 (*)    HCT 31.5 (*)    All other components within normal limits  URINALYSIS, ROUTINE W REFLEX MICROSCOPIC - Abnormal; Notable for the following components:   Hgb urine dipstick MODERATE (*)    Bacteria, UA RARE (*)    All other components within  normal limits  COMPREHENSIVE METABOLIC PANEL - Abnormal; Notable for the following components:   CO2 20 (*)    Glucose, Bld 151 (*)    BUN 44 (*)    Creatinine, Ser 1.79 (*)    GFR, Estimated 31 (*)    All  other components within normal limits  CBC WITH DIFFERENTIAL/PLATELET - Abnormal; Notable for the following components:   RBC 3.52 (*)    Hemoglobin 10.2 (*)    HCT 31.0 (*)    All other components within normal limits  HEMOGLOBIN A1C - Abnormal; Notable for the following components:   Hgb A1c MFr Bld 7.0 (*)    All other components within normal limits  GLUCOSE, CAPILLARY - Abnormal; Notable for the following components:   Glucose-Capillary 152 (*)    All other components within normal limits  GLUCOSE, CAPILLARY - Abnormal; Notable for the following components:   Glucose-Capillary 200 (*)    All other components within normal limits  GLUCOSE, CAPILLARY - Abnormal; Notable for the following components:   Glucose-Capillary 124 (*)    All other components within normal limits  PROTIME-INR  HIV ANTIBODY (ROUTINE TESTING W REFLEX)  MAGNESIUM  TYPE AND SCREEN    EKG None  Radiology DG Tibia/Fibula Right  Result Date: 07/22/2021 CLINICAL DATA:  General RIGHT leg pain, ankle surgery 3 years ago EXAM: RIGHT TIBIA AND FIBULA - 2 VIEW COMPARISON:  06/12/2020 FINDINGS: Osseous demineralization. Knee and ankle joint alignments normal. Degenerative changes RIGHT knee. Postsurgical changes with two K-wires across medial malleolus and lateral plate and screws at distal fibula. The most superior of the syndesmotic screws is chronically fractured. Lucencies are seen surrounding the 2 syndesmotic screws, slightly increased. New minimally displaced oblique distal RIGHT tibial diaphyseal fracture identified. No definite acute RIGHT fibular fracture. No additional fracture, dislocation, or bone destruction. IMPRESSION: Postsurgical changes from prior distal fibular and medial malleolar ORIF.  Minimally displaced acute oblique fracture of the distal RIGHT tibial diaphysis. One of the syndesmotic screws is chronically fractured and lucencies are seen surrounding the 2 syndesmotic screws increased since prior study. Electronically Signed   By: Lavonia Dana M.D.   On: 07/22/2021 17:02   Portable chest 1 View  Result Date: 07/23/2021 CLINICAL DATA:  Preoperative chest clearance for tibial fracture. EXAM: PORTABLE CHEST 1 VIEW COMPARISON:  Chest x-ray 07/05/2019. FINDINGS: The heart size and mediastinal contours are within normal limits. Both lungs are clear. The visualized skeletal structures are unremarkable. IMPRESSION: No active disease. Electronically Signed   By: Ronney Asters M.D.   On: 07/23/2021 00:51    Procedures Procedures    Medications Ordered in ED Medications  aspirin EC tablet 81 mg (has no administration in time range)  rosuvastatin (CRESTOR) tablet 5 mg (has no administration in time range)  heparin injection 5,000 Units (5,000 Units Subcutaneous Given 07/23/21 1456)  acetaminophen (TYLENOL) tablet 650 mg (has no administration in time range)    Or  acetaminophen (TYLENOL) suppository 650 mg (has no administration in time range)  oxyCODONE (Oxy IR/ROXICODONE) immediate release tablet 5 mg (has no administration in time range)  morphine 2 MG/ML injection 2 mg (has no administration in time range)  ondansetron (ZOFRAN) tablet 4 mg (has no administration in time range)    Or  ondansetron (ZOFRAN) injection 4 mg (has no administration in time range)  insulin aspart (novoLOG) injection 0-5 Units (has no administration in time range)  insulin aspart (novoLOG) injection 0-9 Units (1 Units Subcutaneous Given 07/23/21 1724)    ED Course/ Medical Decision Making/ A&P                           Medical Decision Making  This patient presents to the ED for concern of fall, this involves  an extensive number of treatment options, and is a complaint that carries with it a high  risk of complications and morbidity.  The differential diagnosis includes fracture, sprain.   Co morbidities that complicate the patient evaluation  Arthritis, HTN, T2DM   Additional history obtained:  External records from outside source obtained and reviewed including EMR   Lab Tests:  I Ordered, and personally interpreted labs.  The pertinent results include: Baseline creatinine, normal electrolytes, baseline anemia, no leukocytosis, no evidence of coagulopathy, no urine infection, Covid positive   Imaging Studies ordered:  I ordered imaging studies including right lower leg x-ray I independently visualized and interpreted imaging which showed acute oblique fracture to shaft of tibia with small amount of displacement I agree with the radiologist interpretation   Cardiac Monitoring:  The patient was maintained on a cardiac monitor.  I personally viewed and interpreted the cardiac monitored which showed an underlying rhythm of: Sinus rhythm   Medicines ordered and prescription drug management:  Patient declining any analgesia at this time. Reevaluation of the patient after these medicines showed that the patient stayed the same I have reviewed the patients home medicines and have made adjustments as needed   Test Considered:  Additional x-rays.  Patient had full range of motion in joints with no other areas of pain or tenderness on exam.  Consultations Obtained:  I requested consultation with the orthopedic surgery,  and discussed lab and imaging findings as well as pertinent plan - they recommend: Posterior slab splint, admission for operative repair.  N.p.o. at midnight.   Problem List / ED Course:  71 year old female with history of ORIF to right ankle 2 years ago, presenting for suspected injury from a ground-level fall.  Fall was mechanical in nature.  When she fell, her right leg bent underneath her.  She since experienced pain in the mid tibial region.  At  rest, pain is minimal.  Patient, however, has not been able to bear weight due to pain in this area.  X-ray imaging confirmed tibial fracture.  I spoke with orthopedic surgery who recommends admission for operative repair.  They requested posterior slab splint which was ordered.  Patient declines any analgesia.  Lab work was obtained in anticipation of admission.  Patient was admitted to hospitalist for further management.   Reevaluation:  After the interventions noted above, I reevaluated the patient and found that they have :stayed the same   Social Determinants of Health:  Patient has strong family support.  She has previously undergone orthopedic surgery with Dr. Aline Brochure.  She does have a primary care doctor.   Dispostion:  After consideration of the diagnostic results and the patients response to treatment, I feel that the patent would benefit from admission to hospital.          Final Clinical Impression(s) / ED Diagnoses Final diagnoses:  Closed displaced oblique fracture of shaft of right tibia, initial encounter    Rx / DC Orders ED Discharge Orders     None         Godfrey Pick, MD 07/23/21 1756

## 2021-07-23 ENCOUNTER — Other Ambulatory Visit: Payer: Self-pay

## 2021-07-23 ENCOUNTER — Inpatient Hospital Stay (HOSPITAL_COMMUNITY): Payer: Medicare Other

## 2021-07-23 DIAGNOSIS — W010XXA Fall on same level from slipping, tripping and stumbling without subsequent striking against object, initial encounter: Secondary | ICD-10-CM

## 2021-07-23 DIAGNOSIS — S82231A Displaced oblique fracture of shaft of right tibia, initial encounter for closed fracture: Principal | ICD-10-CM

## 2021-07-23 DIAGNOSIS — E119 Type 2 diabetes mellitus without complications: Secondary | ICD-10-CM

## 2021-07-23 DIAGNOSIS — S82309A Unspecified fracture of lower end of unspecified tibia, initial encounter for closed fracture: Secondary | ICD-10-CM

## 2021-07-23 DIAGNOSIS — Y92512 Supermarket, store or market as the place of occurrence of the external cause: Secondary | ICD-10-CM | POA: Diagnosis not present

## 2021-07-23 DIAGNOSIS — U071 COVID-19: Secondary | ICD-10-CM

## 2021-07-23 DIAGNOSIS — S82301K Unspecified fracture of lower end of right tibia, subsequent encounter for closed fracture with nonunion: Secondary | ICD-10-CM

## 2021-07-23 DIAGNOSIS — E782 Mixed hyperlipidemia: Secondary | ICD-10-CM

## 2021-07-23 DIAGNOSIS — E785 Hyperlipidemia, unspecified: Secondary | ICD-10-CM

## 2021-07-23 DIAGNOSIS — I10 Essential (primary) hypertension: Secondary | ICD-10-CM

## 2021-07-23 LAB — URINALYSIS, ROUTINE W REFLEX MICROSCOPIC
Bilirubin Urine: NEGATIVE
Glucose, UA: NEGATIVE mg/dL
Ketones, ur: NEGATIVE mg/dL
Leukocytes,Ua: NEGATIVE
Nitrite: NEGATIVE
Protein, ur: NEGATIVE mg/dL
Specific Gravity, Urine: 1.012 (ref 1.005–1.030)
pH: 5 (ref 5.0–8.0)

## 2021-07-23 LAB — HIV ANTIBODY (ROUTINE TESTING W REFLEX): HIV Screen 4th Generation wRfx: NONREACTIVE

## 2021-07-23 LAB — CBC WITH DIFFERENTIAL/PLATELET
Abs Immature Granulocytes: 0.05 10*3/uL (ref 0.00–0.07)
Basophils Absolute: 0.1 10*3/uL (ref 0.0–0.1)
Basophils Relative: 1 %
Eosinophils Absolute: 0.2 10*3/uL (ref 0.0–0.5)
Eosinophils Relative: 2 %
HCT: 31 % — ABNORMAL LOW (ref 36.0–46.0)
Hemoglobin: 10.2 g/dL — ABNORMAL LOW (ref 12.0–15.0)
Immature Granulocytes: 1 %
Lymphocytes Relative: 21 %
Lymphs Abs: 2.1 10*3/uL (ref 0.7–4.0)
MCH: 29 pg (ref 26.0–34.0)
MCHC: 32.9 g/dL (ref 30.0–36.0)
MCV: 88.1 fL (ref 80.0–100.0)
Monocytes Absolute: 0.9 10*3/uL (ref 0.1–1.0)
Monocytes Relative: 9 %
Neutro Abs: 6.5 10*3/uL (ref 1.7–7.7)
Neutrophils Relative %: 66 %
Platelets: 230 10*3/uL (ref 150–400)
RBC: 3.52 MIL/uL — ABNORMAL LOW (ref 3.87–5.11)
RDW: 14.5 % (ref 11.5–15.5)
WBC: 9.7 10*3/uL (ref 4.0–10.5)
nRBC: 0 % (ref 0.0–0.2)

## 2021-07-23 LAB — COMPREHENSIVE METABOLIC PANEL
ALT: 11 U/L (ref 0–44)
AST: 21 U/L (ref 15–41)
Albumin: 3.5 g/dL (ref 3.5–5.0)
Alkaline Phosphatase: 47 U/L (ref 38–126)
Anion gap: 9 (ref 5–15)
BUN: 44 mg/dL — ABNORMAL HIGH (ref 8–23)
CO2: 20 mmol/L — ABNORMAL LOW (ref 22–32)
Calcium: 9.4 mg/dL (ref 8.9–10.3)
Chloride: 108 mmol/L (ref 98–111)
Creatinine, Ser: 1.79 mg/dL — ABNORMAL HIGH (ref 0.44–1.00)
GFR, Estimated: 31 mL/min — ABNORMAL LOW (ref >60)
Glucose, Bld: 151 mg/dL — ABNORMAL HIGH (ref 70–99)
Potassium: 4.4 mmol/L (ref 3.5–5.1)
Sodium: 137 mmol/L (ref 135–145)
Total Bilirubin: 0.4 mg/dL (ref 0.3–1.2)
Total Protein: 7.3 g/dL (ref 6.5–8.1)

## 2021-07-23 LAB — GLUCOSE, CAPILLARY
Glucose-Capillary: 152 mg/dL — ABNORMAL HIGH (ref 70–99)
Glucose-Capillary: 200 mg/dL — ABNORMAL HIGH (ref 70–99)
Glucose-Capillary: 124 mg/dL — ABNORMAL HIGH (ref 70–99)
Glucose-Capillary: 129 mg/dL — ABNORMAL HIGH (ref 70–99)

## 2021-07-23 LAB — TYPE AND SCREEN
ABO/RH(D): A NEG
Antibody Screen: NEGATIVE

## 2021-07-23 LAB — MAGNESIUM: Magnesium: 1.8 mg/dL (ref 1.7–2.4)

## 2021-07-23 LAB — HEMOGLOBIN A1C
Hgb A1c MFr Bld: 7 % — ABNORMAL HIGH (ref 4.8–5.6)
Mean Plasma Glucose: 154.2 mg/dL

## 2021-07-23 MED ORDER — INSULIN ASPART 100 UNIT/ML IJ SOLN: 0.0000 [IU] | Freq: Every day | INTRAMUSCULAR | Status: DC

## 2021-07-23 MED ORDER — INSULIN ASPART 100 UNIT/ML IJ SOLN
0.0000 [IU] | Freq: Three times a day (TID) | INTRAMUSCULAR | Status: DC
  Administered 2021-07-23: 3 [IU] via SUBCUTANEOUS

## 2021-07-23 MED ORDER — INSULIN ASPART 100 UNIT/ML IJ SOLN
0.0000 [IU] | Freq: Three times a day (TID) | INTRAMUSCULAR | Status: DC
  Administered 2021-07-23: 2 [IU] via SUBCUTANEOUS
  Administered 2021-07-23: 1 [IU] via SUBCUTANEOUS
  Administered 2021-07-24: 2 [IU] via SUBCUTANEOUS
  Administered 2021-07-24: 1 [IU] via SUBCUTANEOUS

## 2021-07-23 NOTE — Evaluation (Signed)
Physical Therapy Evaluation Patient Details Name: Nicole Sampson MRN: [id number] DOB: 12-30-1950 Today's Date: 07/23/2021  History of Present Illness  Nicole Sampson  is a 71 y.o. female, with history of GERD, hypertension, type 2 diabetes mellitus, presents ED with a chief complaint of mechanical fall.  Patient reports that she was at the store and she went to turn and walk off when her foot slid her knee buckled and she fell down.  She said it was like her right leg went underneath her.  She immediately had pain and was not able to weight-bear.  She reports that at rest the pain is achy and mild.  With weightbearing it is sharp and severe.  Her knee and her ankle both hurt.  She does not have any low back pain or hip pain.  She did not hit her head.  She did not pass out.  She does not take any blood thinners.  Patient reports no preceding symptoms, just a slip and fall.  She is otherwise been in her normal state of health.   Clinical Impression  Patient demonstrates good return moving RLE during bed mobility, tends to hop when taking steps and required instruction to TTWB on right foot to balane self when advancing LLE with good return demonstrated and tolerated walking in room without loss of balance.  Patient demonstrates good return for transferring to [facility], chair and tolerated sitting up in chair after therapy - nurse notified.  Patient will benefit from continued skilled physical therapy in hospital and recommended venue below to increase strength, balance, endurance for safe ADLs and gait.         Recommendations for follow up therapy are one component of a multi-disciplinary discharge planning process, led by the attending physician.  Recommendations may be updated based on patient status, additional functional criteria and insurance authorization.  Follow Up Recommendations Home health PT    Assistance Recommended at Discharge Set up Supervision/Assistance  Patient can return home with the  following  A little help with walking and/or transfers;A little help with bathing/dressing/bathroom    Equipment Recommendations None recommended by PT  Recommendations for Other Services       Functional Status Assessment Patient has had a recent decline in their functional status and demonstrates the ability to make significant improvements in function in a reasonable and predictable amount of time.     Precautions / Restrictions Precautions Precautions: Fall Restrictions Weight Bearing Restrictions: Yes RUE Weight Bearing: Touch down weight bearing      Mobility  Bed Mobility Overal bed mobility: Modified Independent                  Transfers Overall transfer level: Needs assistance Equipment used: Rolling walker (2 wheels) Transfers: Sit to/from Stand;Bed to chair/wheelchair/BSC Sit to Stand: Supervision   Step pivot transfers: Min guard       General transfer comment: required verbal cueing for proper hand placement with fair/good carryover    Ambulation/Gait Ambulation/Gait assistance: Min guard Gait Distance (Feet): 20 Feet Assistive device: Rolling walker (2 wheels) Gait Pattern/deviations: Decreased step length - right;Decreased step length - left;Decreased stance time - right;Decreased stride length;Antalgic Gait velocity: decreased     General Gait Details: Patient required verbal cues and demonstrate for proper TTWB on Right foot without hopping with fair/good carryover demonstrated and able to ambulate in room without loss of balance  Stairs            Wheelchair Mobility    Modified  Rankin (Stroke Patients Only)       Balance Overall balance assessment: Needs assistance Sitting-balance support: Feet supported;No upper extremity supported Sitting balance-Leahy Scale: Good Sitting balance - Comments: seated at EOB   Standing balance support: Reliant on assistive device for balance;During functional activity;Bilateral upper  extremity supported Standing balance-Leahy Scale: Fair Standing balance comment: using RW                             Pertinent Vitals/Pain Pain Assessment: Faces Faces Pain Scale: Hurts little more Pain Location: right ankle/foot Pain Descriptors / Indicators: Sore Pain Intervention(s): Limited activity within patient's tolerance;Monitored during session;Repositioned    Home Living Family/patient expects to be discharged to:: Private residence Living Arrangements: Alone Available Help at Discharge: Friend(s);Available 24 hours/day Type of Home: House Home Access: Stairs to enter Entrance Stairs-Rails: None Entrance Stairs-Number of Steps: 2   Home Layout: One level Home Equipment: Cane - single Barista (2 wheels)      Prior Function Prior Level of Function : Independent/Modified Independent             Mobility Comments: Hydrographic surveyor, drives ADLs Comments: Independent     Hand Dominance        Extremity/Trunk Assessment   Upper Extremity Assessment Upper Extremity Assessment: Overall WFL for tasks assessed    Lower Extremity Assessment Lower Extremity Assessment: Generalized weakness;RLE deficits/detail RLE Deficits / Details: grossly 3+/5 except ankle/foot not tested due to splint RLE: Unable to fully assess due to immobilization RLE Sensation: WNL RLE Coordination: WNL    Cervical / Trunk Assessment Cervical / Trunk Assessment: Normal  Communication   Communication: No difficulties  Cognition Arousal/Alertness: Awake/alert Behavior During Therapy: WFL for tasks assessed/performed Overall Cognitive Status: Within Functional Limits for tasks assessed                                          General Comments      Exercises     Assessment/Plan    PT Assessment Patient needs continued PT services  PT Problem List Decreased strength;Decreased activity tolerance;Decreased balance;Decreased  mobility       PT Treatment Interventions DME instruction;Gait training;Stair training;Functional mobility training;Therapeutic activities;Therapeutic exercise;Patient/family education;Balance training    PT Goals (Current goals can be found in the Care Plan section)  Acute Rehab PT Goals Patient Stated Goal: return home with friend to assist PT Goal Formulation: With patient Time For Goal Achievement: 07/30/21 Potential to Achieve Goals: Good    Frequency Min 3X/week     Co-evaluation               AM-PAC PT "6 Clicks" Mobility  Outcome Measure Help needed turning from your back to your side while in a flat bed without using bedrails?: None Help needed moving from lying on your back to sitting on the side of a flat bed without using bedrails?: None Help needed moving to and from a bed to a chair (including a wheelchair)?: A Little Help needed standing up from a chair using your arms (e.g., wheelchair or bedside chair)?: A Little Help needed to walk in hospital room?: A Little Help needed climbing 3-5 steps with a railing? : A Lot 6 Click Score: 19    End of Session   Activity Tolerance: Patient tolerated treatment well;Patient limited by fatigue Patient left: in chair;with  call bell/phone within reach Nurse Communication: Mobility status PT Visit Diagnosis: Unsteadiness on feet (R26.81);Other abnormalities of gait and mobility (R26.89);Muscle weakness (generalized) (M62.81)    Time: 3005-1102 PT Time Calculation (min) (ACUTE ONLY): 23 min   Charges:   PT Evaluation $PT Eval Moderate Complexity: 1 Mod PT Treatments $Therapeutic Activity: 23-37 mins        2:27 PM, 07/23/21 Lonell Grandchild, MPT Physical Therapist with [facility] [phone] office [phone] mobile phone

## 2021-07-23 NOTE — H&P (Signed)
TRH H&P    Patient Demographics:    Nicole Sampson, is a 71 y.o. female  MRN: [id number]  DOB - June 08, 1951  Admit Date - 07/22/2021  Referring MD/NP/PA: Doren Custard  Outpatient Primary MD for the patient is Berenice Primas  Patient coming from: Home  Chief complaint- mechanical fall   HPI:    Nicole Sampson  is a 71 y.o. female, with history of GERD, hypertension, type 2 diabetes mellitus, presents ED with a chief complaint of mechanical fall.  Patient reports that she was at the store and she went to turn and walk off when her foot slid her knee buckled and she fell down.  She said it was like her right leg went underneath her.  She immediately had pain and was not able to weight-bear.  She reports that at rest the pain is achy and mild.  With weightbearing it is sharp and severe.  Her knee and her ankle both hurt.  She does not have any low back pain or hip pain.  She did not hit her head.  She did not pass out.  She does not take any blood thinners.  Patient reports no preceding symptoms, just a slip and fall.  She is otherwise been in her normal state of health.  Patient does not smoke, does not drink alcohol, does not use illicit drugs.  Patient is vaccinated for COVID.  Patient is full code.  In the ED Temp 98.2, heart rate 82-90, respiratory rate 15-17, blood pressure 122/46, satting at 97% Tibia x-ray shows minimally displaced acute oblique fracture of the distal right tibia diaphysis No leukocytosis White blood cell count 9.3, hemoglobin 10.5 Chemistry reveals a elevated creatinine that is close to patient's baseline Ortho was consulted and plans to see patient in the a.m. recommend splint for tonight which is already placed Patient n.p.o. after midnight Admission requested for Ortho evaluation in a.m.    Review of systems:    In addition to the HPI above,  No Fever-chills, No Headache, No changes  with Vision or hearing, No problems swallowing food or Liquids, No Chest pain, Cough or Shortness of Breath, No Abdominal pain, No Nausea or Vomiting, bowel movements are regular, No Blood in stool or Urine, No dysuria, No new skin rashes or bruises, No new weakness, tingling, numbness in any extremity, No recent weight gain or loss, No polyuria, polydypsia or polyphagia, No significant Mental Stressors.  All other systems reviewed and are negative.    Past History of the following :    Past Medical History:  Diagnosis Date   Arthritis    knees   Full dentures    GERD (gastroesophageal reflux disease)    Hypertension    followed by pcp  (05-10-2019  per pt never had a stress test)   PMB (postmenopausal bleeding)    Type 2 diabetes mellitus ([place])    followed by pcp  (05-10-2019 check's cbg twice weekly,  fasting cbg-- 103)   Wears glasses       Past Surgical History:  Procedure  Laterality Date   BREAST MASS EXCISION Left 04/1996   HYSTEROSCOPY WITH D & C N/A 05/11/2019   Procedure: DILATATION AND CURETTAGE /HYSTEROSCOPY;  Surgeon: Vanessa Kick, MD;  Location: [facility];  Service: Gynecology;  Laterality: N/A;   ORIF ANKLE FRACTURE Right 07/06/2019   Procedure: OPEN REDUCTION INTERNAL FIXATION (ORIF) ANKLE FRACTURE;  Surgeon: Carole Civil, MD;  Location: [facility];  Service: Orthopedics;  Laterality: Right;   ROBOTIC ASSISTED TOTAL HYSTERECTOMY WITH BILATERAL SALPINGO OOPHERECTOMY Bilateral 06/15/2019   Procedure: XI ROBOTIC ASSISTED TOTAL HYSTERECTOMY GREATER THAN 250 GRAMS, WITH BILATERAL SALPINGO OOPHORECTOMY;  Surgeon: Everitt Amber, MD;  Location: [facility];  Service: Gynecology;  Laterality: Bilateral;   SENTINEL NODE BIOPSY N/A 06/15/2019   Procedure: SENTINEL NODE BIOPSY;  Surgeon: Everitt Amber, MD;  Location: [facility];  Service: Gynecology;  Laterality: N/A;      Social History:      Social History   Tobacco Use   Smoking status: Never    Smokeless tobacco: Never  Substance Use Topics   Alcohol use: Never       Family History :     Family History  Problem Relation Age of Onset   Uterine cancer Mother    Diabetes Mother    Hypertension Mother    Diabetes Father    Hypertension Brother       Home Medications:   Prior to Admission medications   Medication Sig Start Date End Date Taking? Authorizing Provider  ascorbic acid (VITAMIN C) 500 MG tablet Take 500 mg by mouth daily.   Yes [provider]  aspirin EC 81 MG tablet Take 81 mg by mouth every other day.    Yes [provider]  Calcium Citrate-Vitamin D (CALCIUM + D PO) Take 1 tablet by mouth daily.    Yes [provider]  hydrochlorothiazide (HYDRODIURIL) 12.5 MG tablet Take 12.5 mg by mouth daily. 02/23/20  Yes [provider]  losartan (COZAAR) 50 MG tablet Take 50 mg by mouth daily.   Yes [provider]  metFORMIN (GLUCOPHAGE) 500 MG tablet Take 500 mg by mouth daily with breakfast.    Yes [provider]  Multiple Vitamin (MULTIVITAMIN) tablet Take 1 tablet by mouth daily.   Yes [provider]  rosuvastatin (CRESTOR) 5 MG tablet Take 5 mg by mouth once a week. 12/08/19  Yes [provider]     Allergies:     Allergies  Allergen Reactions   Other Swelling and Other (See Comments)    "lime jello causes swelling"     Physical Exam:   Vitals  Blood pressure (!) 145/76, pulse 82, temperature 98.5 F (36.9 C), resp. rate 18, height 5\' 8"  (1.727 m), weight 120 kg, SpO2 99 %.   1.  General: Patient lying supine in bed,  no acute distress   2. Psychiatric: Alert and oriented x 3, mood and behavior normal for situation, pleasant and cooperative with exam   3. Neurologic: Speech and language are normal, face is symmetric, moves all 4 extremities voluntarily, at baseline without acute deficits on limited exam   4. HEENMT:  Head is atraumatic, normocephalic, pupils reactive to  light, neck is supple, trachea is midline, mucous membranes are moist   5. Respiratory : Lungs are clear to auscultation bilaterally without wheezing, rhonchi, rales, no cyanosis, no increase in work of breathing or accessory muscle use   6. Cardiovascular : Heart rate normal, rhythm is regular, no murmurs, rubs or gallops,  no peripheral edema, peripheral pulses palpated   7. Gastrointestinal:  Abdomen is soft, nondistended, nontender to palpation bowel sounds active, no masses or organomegaly palpated   8. Skin:  Skin is warm, dry and intact without rashes, acute lesions, or ulcers on limited exam   9.Musculoskeletal:  Right knee effusion, right lower extremity in splint, patient still has sensation in her toes and can move her toes on the right, no acute changes in the left lower extremity     Data Review:    CBC Recent Labs  Lab 07/22/21 1908  WBC 9.3  HGB 10.5*  HCT 31.5*  PLT 216  MCV 89.0  MCH 29.7  MCHC 33.3  RDW 14.2   ------------------------------------------------------------------------------------------------------------------  Results for orders placed or performed during the hospital encounter of 07/22/21 (from the past 48 hour(s))  Comprehensive metabolic panel     Status: Abnormal   Collection Time: 07/22/21  7:08 PM  Result Value Ref Range   Sodium 136 135 - 145 mmol/L   Potassium 4.8 3.5 - 5.1 mmol/L   Chloride 106 98 - 111 mmol/L   CO2 23 22 - 32 mmol/L   Glucose, Bld 164 (H) 70 - 99 mg/dL    Comment: Glucose reference range applies only to samples taken after fasting for at least 8 hours.   BUN 47 (H) 8 - 23 mg/dL   Creatinine, Ser 1.80 (H) 0.44 - 1.00 mg/dL   Calcium 9.4 8.9 - 10.3 mg/dL   Total Protein 7.4 6.5 - 8.1 g/dL   Albumin 3.6 3.5 - 5.0 g/dL   AST 16 15 - 41 U/L   ALT 10 0 - 44 U/L   Alkaline Phosphatase 46 38 - 126 U/L   Total Bilirubin 0.2 (L) 0.3 - 1.2 mg/dL   GFR, Estimated 30 (L) >60 mL/min    Comment: (NOTE) Calculated  using the CKD-EPI Creatinine Equation (2021)    Anion gap 7 5 - 15    Comment: Performed at [facility], [address] [place], [place] [address]  CBC     Status: Abnormal   Collection Time: 07/22/21  7:08 PM  Result Value Ref Range   WBC 9.3 4.0 - 10.5 K/uL   RBC 3.54 (L) 3.87 - 5.11 MIL/uL   Hemoglobin 10.5 (L) 12.0 - 15.0 g/dL   HCT 31.5 (L) 36.0 - 46.0 %   MCV 89.0 80.0 - 100.0 fL   MCH 29.7 26.0 - 34.0 pg   MCHC 33.3 30.0 - 36.0 g/dL   RDW 14.2 11.5 - 15.5 %   Platelets 216 150 - 400 K/uL   nRBC 0.0 0.0 - 0.2 %    Comment: Performed at [facility], [address] [place], [place] [address]  Protime-INR     Status: None   Collection Time: 07/22/21  7:08 PM  Result Value Ref Range   Prothrombin Time 13.0 11.4 - 15.2 seconds   INR 1.0 0.8 - 1.2    Comment: (NOTE) INR goal varies based on device and disease states. Performed at [facility], [address], [place], [place] [address]   Resp Panel by RT-PCR (Flu A&B, Covid) Nasopharyngeal Swab     Status: Abnormal   Collection Time: 07/22/21  8:26 PM   Specimen: Nasopharyngeal Swab; Nasopharyngeal(NP) swabs in vial transport medium  Result Value Ref Range   SARS Coronavirus 2 by RT PCR POSITIVE (A) NEGATIVE    Comment: (NOTE) SARS-CoV-2 target nucleic acids are DETECTED.  The SARS-CoV-2 RNA is generally  detectable in upper respiratory specimens during the acute phase of infection. Positive results are indicative of the presence of the identified virus, but do not rule out bacterial infection or co-infection with other pathogens not detected by the test. Clinical correlation with patient history and other diagnostic information is necessary to determine patient infection status. The expected result is Negative.  Fact Sheet for Patients: EntrepreneurPulse.com.au  Fact Sheet for Healthcare Providers: IncredibleEmployment.be  This test is not yet approved or cleared by the [place] FDA and  has been authorized for detection and/or diagnosis of SARS-CoV-2 by FDA under an Emergency Use Authorization (EUA).  This EUA will remain in effect (meaning this test can be used) for the duration of  the COVID-19 declaration under Section 564(b)(1) of the A ct, 21 U.S.C. section 360bbb-3(b)(1), unless the authorization is terminated or revoked sooner.     Influenza A by PCR NEGATIVE NEGATIVE   Influenza B by PCR NEGATIVE NEGATIVE    Comment: (NOTE) The Xpert Xpress SARS-CoV-2/FLU/RSV plus assay is intended as an aid in the diagnosis of influenza from Nasopharyngeal swab specimens and should not be used as a sole basis for treatment. Nasal washings and aspirates are unacceptable for Xpert Xpress SARS-CoV-2/FLU/RSV testing.  Fact Sheet for Patients: EntrepreneurPulse.com.au  Fact Sheet for Healthcare Providers: IncredibleEmployment.be  This test is not yet approved or cleared by the [place] FDA and has been authorized for detection and/or diagnosis of SARS-CoV-2 by FDA under an Emergency Use Authorization (EUA). This EUA will remain in effect (meaning this test can be used) for the duration of the COVID-19 declaration under Section 564(b)(1) of the Act, 21 U.S.C. section 360bbb-3(b)(1), unless the authorization is terminated or revoked.  Performed at [facility], [address] [place], [place] [address]     Chemistries  Recent Labs  Lab 07/22/21 1908  NA 136  K 4.8  CL 106  CO2 23  GLUCOSE 164*  BUN 47*  CREATININE 1.80*  CALCIUM 9.4  AST 16  ALT 10  ALKPHOS 46  BILITOT 0.2*   ------------------------------------------------------------------------------------------------------------------  ------------------------------------------------------------------------------------------------------------------ GFR: Estimated Creatinine Clearance: 40.8 mL/min (A) (by C-G formula based on SCr of 1.8 mg/dL  (H)). Liver Function Tests: Recent Labs  Lab 07/22/21 1908  AST 16  ALT 10  ALKPHOS 46  BILITOT 0.2*  PROT 7.4  ALBUMIN 3.6   No results for input(s): LIPASE, AMYLASE in the last 168 hours. No results for input(s): AMMONIA in the last 168 hours. Coagulation Profile: Recent Labs  Lab 07/22/21 1908  INR 1.0   Cardiac Enzymes: No results for input(s): CKTOTAL, CKMB, CKMBINDEX, TROPONINI in the last 168 hours. BNP (last 3 results) No results for input(s): PROBNP in the last 8760 hours. HbA1C: No results for input(s): HGBA1C in the last 72 hours. CBG: No results for input(s): GLUCAP in the last 168 hours. Lipid Profile: No results for input(s): CHOL, HDL, LDLCALC, TRIG, CHOLHDL, LDLDIRECT in the last 72 hours. Thyroid Function Tests: No results for input(s): TSH, T4TOTAL, FREET4, T3FREE, THYROIDAB in the last 72 hours. Anemia Panel: No results for input(s): VITAMINB12, FOLATE, FERRITIN, TIBC, IRON, RETICCTPCT in the last 72 hours.  --------------------------------------------------------------------------------------------------------------- Urine analysis:    Component Value Date/Time   COLORURINE YELLOW 06/14/2019 [place] 06/14/2019 1049   LABSPEC 1.009 06/14/2019 1049   PHURINE 5.0 06/14/2019 1049   GLUCOSEU NEGATIVE 06/14/2019 1049   HGBUR MODERATE (A) 06/14/2019 1049   BILIRUBINUR NEGATIVE 06/14/2019 1049   KETONESUR NEGATIVE 06/14/2019 1049  PROTEINUR NEGATIVE 06/14/2019 1049   NITRITE NEGATIVE 06/14/2019 1049   LEUKOCYTESUR LARGE (A) 06/14/2019 1049      Imaging Results:    DG Tibia/Fibula Right  Result Date: 07/22/2021 CLINICAL DATA:  General RIGHT leg pain, ankle surgery 3 years ago EXAM: RIGHT TIBIA AND FIBULA - 2 VIEW COMPARISON:  06/12/2020 FINDINGS: Osseous demineralization. Knee and ankle joint alignments normal. Degenerative changes RIGHT knee. Postsurgical changes with two K-wires across medial malleolus and lateral plate and  screws at distal fibula. The most superior of the syndesmotic screws is chronically fractured. Lucencies are seen surrounding the 2 syndesmotic screws, slightly increased. New minimally displaced oblique distal RIGHT tibial diaphyseal fracture identified. No definite acute RIGHT fibular fracture. No additional fracture, dislocation, or bone destruction. IMPRESSION: Postsurgical changes from prior distal fibular and medial malleolar ORIF. Minimally displaced acute oblique fracture of the distal RIGHT tibial diaphysis. One of the syndesmotic screws is chronically fractured and lucencies are seen surrounding the 2 syndesmotic screws increased since prior study. Electronically Signed   By: Lavonia Dana M.D.   On: 07/22/2021 17:02   Portable chest 1 View  Result Date: 07/23/2021 CLINICAL DATA:  Preoperative chest clearance for tibial fracture. EXAM: PORTABLE CHEST 1 VIEW COMPARISON:  Chest x-ray 07/05/2019. FINDINGS: The heart size and mediastinal contours are within normal limits. Both lungs are clear. The visualized skeletal structures are unremarkable. IMPRESSION: No active disease. Electronically Signed   By: Ronney Asters M.D.   On: 07/23/2021 00:51       Assessment & Plan:    Principal Problem:   Tibia fracture Active Problems:   Diabetes mellitus ([place])   Essential hypertension   Hyperlipidemia   Tibia fracture Splint in place N.p.o. after midnight UA, type and screen, EKG, chest x-ray for preop eval Ortho to see in the a.m. Pain scale for pain control Continue to monitor Diabetes mellitus type 2 Sliding scale coverage Hold metformin Hypertension Continue hydrochlorothiazide and losartan Hyperlipidemia Continue statin    DVT Prophylaxis-   Heparin - SCDs   AM Labs Ordered, also please review Full Orders  Family Communication: No family at bedside  Code Status: Full  Admission status: Inpatient :The appropriate admission status for this patient is INPATIENT. Inpatient status  is judged to be reasonable and necessary in order to provide the required intensity of service to ensure the patient's safety. The patient's presenting symptoms, physical exam findings, and initial radiographic and laboratory data in the context of their chronic comorbidities is felt to place them at high risk for further clinical deterioration. Furthermore, it is not anticipated that the patient will be medically stable for discharge from the hospital within 2 midnights of admission. The following factors support the admission status of inpatient.     The patient's presenting symptoms include unable to weight-bear. The worrisome physical exam findings include unable to weight-bear. The initial radiographic and laboratory data are worrisome because of tibia fracture. The chronic co-morbidities include hypertension, hyperlipidemia, diabetes mellitus type 2.       * I certify that at the point of admission it is my clinical judgment that the patient will require inpatient hospital care spanning beyond 2 midnights from the point of admission due to high intensity of service, high risk for further deterioration and high frequency of surveillance required.*  Disposition: Anticipated Discharge date 24-48 hours discharge to home versus rehab  Time spent in minutes : [place] DO

## 2021-07-23 NOTE — Progress Notes (Signed)
EKG done and given to nurse 

## 2021-07-23 NOTE — Progress Notes (Signed)
PROGRESS NOTE  Nicole Sampson OVF:[id number] DOB: Sep 04, 1950 DOA: 07/22/2021 PCP: Berenice Primas  Brief History:  71 year old female with a history of hypertension, diabetes mellitus type 2, GERD, endometrial carcinoma status post TAH with BSO presenting after slipping and falling at [place] on 07/22/2021.  The patient actually drove to Dr. Ruthe Mannan office and got out of the car but had severe right lower extremity pain.  She was instructed go to the emergency department for further evaluation.  X-rays of the right tibia/fib showed a minimal displaced acute fracture of the distal right tibia diaphysis.  Patient denies fevers, chills, headache, chest pain, dyspnea, nausea, vomiting, diarrhea, abdominal pain, dysuria, hematuria, hematochezia, and melena. The patient was admitted for further evaluation and treatment.  Orthopedics was consulted.  The patient was also noted to be incidentally positive for COVID-19.  Assessment/Plan: Acute right tibial fracture -Appreciate orthopedic consult -Case discussed with Dr. Neil Crouch not require surgery -Toe-touch weightbearing -Judicious opioids -PT evaluation -Dr. Aline Brochure recommends waiting 24 hours before placing a cast to avoid lower extremity swelling causing compartment syndrome  COVID-19 -Notably, patient COVID-19 positive from home test 07/02/2021 -She took 5 days Paxlovid at that time -She is essentially asymptomatic -Stable on room air  CKD stage IIIb -Baseline creatinine 1.5-1.8 -Holding losartan -Hold HCTZ  Diabetes mellitus type 2 -Holding metformin -NovoLog sliding scale -Check A1c  Hyperlipidemia -Continue statin  Morbid Obesity BMI 40.22 -lifestyle modification             Family Communication:   no Family at bedside  Consultants:  ortho  Code Status:  FULL  DVT Prophylaxis:  [place] Heparin   Procedures: As Listed in Progress Note Above  Antibiotics: None    Total time  spent 40 minutes.  Greater than 50% spent face to face counseling and coordinating care.     Subjective: She complains of pain in the right lower extremity.  She denies any headache, fevers, chills, cough, hemoptysis, shortness of breath, chest pain, nausea, vomiting, diarrhea  Objective: Vitals:   07/22/21 1830 07/22/21 2135 07/23/21 0105 07/23/21 0519  BP: (!) 122/46 (!) 159/78 (!) 145/76 (!) 148/61  Pulse: 82 99 82 89  Resp: 17 18 18 20   Temp:  98.3 F (36.8 C) 98.5 F (36.9 C) 98.3 F (36.8 C)  TempSrc:    Oral  SpO2: 97% 99% 99% 96%  Weight:  120 kg    Height:  5\' 8"  (1.727 m)     No intake or output data in the 24 hours ending 07/23/21 0808 Weight change:  Exam:  General:  Pt is alert, follows commands appropriately, not in acute distress HEENT: No icterus, No thrush, No neck mass, [place]/AT Cardiovascular: RRR, S1/S2, no rubs, no gallops Respiratory: CTA bilaterally, no wheezing, no crackles, no rhonchi Abdomen: Soft/+BS, non tender, non distended, no guarding Extremities: Right lower extremity in a splint   Data Reviewed: I have personally reviewed following labs and imaging studies Basic Metabolic Panel: Recent Labs  Lab 07/22/21 1908 07/23/21 0537  NA 136 137  K 4.8 4.4  CL 106 108  CO2 23 20*  GLUCOSE 164* 151*  BUN 47* 44*  CREATININE 1.80* 1.79*  CALCIUM 9.4 9.4  MG  --  1.8   Liver Function Tests: Recent Labs  Lab 07/22/21 1908 07/23/21 0537  AST 16 21  ALT 10 11  ALKPHOS 46 47  BILITOT 0.2* 0.4  PROT 7.4 7.3  ALBUMIN 3.6  3.5   No results for input(s): LIPASE, AMYLASE in the last 168 hours. No results for input(s): AMMONIA in the last 168 hours. Coagulation Profile: Recent Labs  Lab 07/22/21 1908  INR 1.0   CBC: Recent Labs  Lab 07/22/21 1908 07/23/21 0537  WBC 9.3 9.7  NEUTROABS  --  6.5  HGB 10.5* 10.2*  HCT 31.5* 31.0*  MCV 89.0 88.1  PLT 216 230   Cardiac Enzymes: No results for input(s): CKTOTAL, CKMB, CKMBINDEX,  TROPONINI in the last 168 hours. BNP: Invalid input(s): POCBNP CBG: No results for input(s): GLUCAP in the last 168 hours. HbA1C: No results for input(s): HGBA1C in the last 72 hours. Urine analysis:    Component Value Date/Time   COLORURINE YELLOW 07/23/2021 [place] 07/23/2021 0531   LABSPEC 1.012 07/23/2021 0531   PHURINE 5.0 07/23/2021 0531   GLUCOSEU NEGATIVE 07/23/2021 0531   HGBUR MODERATE (A) 07/23/2021 0531   BILIRUBINUR NEGATIVE 07/23/2021 0531   KETONESUR NEGATIVE 07/23/2021 0531   PROTEINUR NEGATIVE 07/23/2021 0531   NITRITE NEGATIVE 07/23/2021 0531   LEUKOCYTESUR NEGATIVE 07/23/2021 0531   Sepsis Labs: @LABRCNTIP (procalcitonin:4,lacticidven:4) ) Recent Results (from the past 240 hour(s))  Resp Panel by RT-PCR (Flu A&B, Covid) Nasopharyngeal Swab     Status: Abnormal   Collection Time: 07/22/21  8:26 PM   Specimen: Nasopharyngeal Swab; Nasopharyngeal(NP) swabs in vial transport medium  Result Value Ref Range Status   SARS Coronavirus 2 by RT PCR POSITIVE (A) NEGATIVE Final    Comment: (NOTE) SARS-CoV-2 target nucleic acids are DETECTED.  The SARS-CoV-2 RNA is generally detectable in upper respiratory specimens during the acute phase of infection. Positive results are indicative of the presence of the identified virus, but do not rule out bacterial infection or co-infection with other pathogens not detected by the test. Clinical correlation with patient history and other diagnostic information is necessary to determine patient infection status. The expected result is Negative.  Fact Sheet for Patients: EntrepreneurPulse.com.au  Fact Sheet for Healthcare Providers: IncredibleEmployment.be  This test is not yet approved or cleared by the [place] FDA and  has been authorized for detection and/or diagnosis of SARS-CoV-2 by FDA under an Emergency Use Authorization (EUA).  This EUA will remain in effect  (meaning this test can be used) for the duration of  the COVID-19 declaration under Section 564(b)(1) of the A ct, 21 U.S.C. section 360bbb-3(b)(1), unless the authorization is terminated or revoked sooner.     Influenza A by PCR NEGATIVE NEGATIVE Final   Influenza B by PCR NEGATIVE NEGATIVE Final    Comment: (NOTE) The Xpert Xpress SARS-CoV-2/FLU/RSV plus assay is intended as an aid in the diagnosis of influenza from Nasopharyngeal swab specimens and should not be used as a sole basis for treatment. Nasal washings and aspirates are unacceptable for Xpert Xpress SARS-CoV-2/FLU/RSV testing.  Fact Sheet for Patients: EntrepreneurPulse.com.au  Fact Sheet for Healthcare Providers: IncredibleEmployment.be  This test is not yet approved or cleared by the [place] FDA and has been authorized for detection and/or diagnosis of SARS-CoV-2 by FDA under an Emergency Use Authorization (EUA). This EUA will remain in effect (meaning this test can be used) for the duration of the COVID-19 declaration under Section 564(b)(1) of the Act, 21 U.S.C. section 360bbb-3(b)(1), unless the authorization is terminated or revoked.  Performed at [facility], [address], [place],  [address]      Scheduled Meds:  [START ON 07/24/2021] aspirin EC  81 mg Oral QODAY  heparin  5,000 Units Subcutaneous Q8H   insulin aspart  0-15 Units Subcutaneous TID WC   [START ON 07/28/2021] rosuvastatin  5 mg Oral Weekly   Continuous Infusions:  Procedures/Studies: DG Tibia/Fibula Right  Result Date: 07/22/2021 CLINICAL DATA:  General RIGHT leg pain, ankle surgery 3 years ago EXAM: RIGHT TIBIA AND FIBULA - 2 VIEW COMPARISON:  06/12/2020 FINDINGS: Osseous demineralization. Knee and ankle joint alignments normal. Degenerative changes RIGHT knee. Postsurgical changes with two K-wires across medial malleolus and lateral plate and screws at distal fibula. The most superior of  the syndesmotic screws is chronically fractured. Lucencies are seen surrounding the 2 syndesmotic screws, slightly increased. New minimally displaced oblique distal RIGHT tibial diaphyseal fracture identified. No definite acute RIGHT fibular fracture. No additional fracture, dislocation, or bone destruction. IMPRESSION: Postsurgical changes from prior distal fibular and medial malleolar ORIF. Minimally displaced acute oblique fracture of the distal RIGHT tibial diaphysis. One of the syndesmotic screws is chronically fractured and lucencies are seen surrounding the 2 syndesmotic screws increased since prior study. Electronically Signed   By: Lavonia Dana M.D.   On: 07/22/2021 17:02   Portable chest 1 View  Result Date: 07/23/2021 CLINICAL DATA:  Preoperative chest clearance for tibial fracture. EXAM: PORTABLE CHEST 1 VIEW COMPARISON:  Chest x-ray 07/05/2019. FINDINGS: The heart size and mediastinal contours are within normal limits. Both lungs are clear. The visualized skeletal structures are unremarkable. IMPRESSION: No active disease. Electronically Signed   By: Ronney Asters M.D.   On: 07/23/2021 00:51    Orson Eva, DO  [facility]  If 7PM-7AM, please contact night-coverage [email or web address] Password [email or web address] 07/23/2021, 8:08 AM   LOS: 1 day

## 2021-07-23 NOTE — Progress Notes (Signed)
Patient ID: Nicole Sampson, female   DOB: 1950-09-18, 71 y.o.   MRN: [id number] Orthopedic note  71 year old female status post ORIF of the right ankle presents now with a new fracture right tibia minimal displacement minimal angulation does not require surgery  Patient will have to be toe-touch weightbearing  However she now has positive COVID test which may or may not affect her disposition  Recommend short leg cast after 48 hours

## 2021-07-23 NOTE — Progress Notes (Signed)
Patient positive for COVID, patient reported no complaints of cough, congestion or SOB. Patient ambulated from chair to [facility] then back to bed. Applied ice to right lower leg and elevated leg, patient reports no complaints of pain.

## 2021-07-23 NOTE — Plan of Care (Signed)
°  Problem: Acute Rehab PT Goals(only PT should resolve) Goal: Pt Will Go Supine/Side To Sit Outcome: Progressing Flowsheets (Taken 07/23/2021 1428) Pt will go Supine/Side to Sit:  Independently  with modified independence Goal: Patient Will Transfer Sit To/From Stand Outcome: Progressing Flowsheets (Taken 07/23/2021 1428) Patient will transfer sit to/from stand:  with modified independence  with supervision Goal: Pt Will Transfer Bed To Chair/Chair To Bed Outcome: Progressing Flowsheets (Taken 07/23/2021 1428) Pt will Transfer Bed to Chair/Chair to Bed:  with modified independence  with supervision Goal: Pt Will Ambulate Outcome: Progressing Flowsheets (Taken 07/23/2021 1428) Pt will Ambulate:  50 feet  with supervision  with rolling walker   2:28 PM, 07/23/21 Lonell Grandchild, MPT Physical Therapist with [facility] [phone] office [phone] mobile phone

## 2021-07-23 NOTE — Progress Notes (Addendum)
Patient ID: Nicole Sampson, female   DOB: Oct 02, 1950, 71 y.o.   MRN: [id number]  71 year old female with a fresh fracture right tibia  Recommend ice and elevation of the right lower extremity to prevent swelling and potential compartment syndrome  Recommend putting the cast on after 24 hours to assure that the leg does not swell in the cast have to be removed and then no compartment syndrome develops  Patient in a physical therapy for [place] weightbearing

## 2021-07-23 NOTE — Consult Note (Addendum)
Reason for Consult: Right tibial shaft fracture Referring Physician: Dr. Henry Russel  Nicole Sampson is an 71 y.o. female.  HPI: Nicole Sampson is 71 years old she went to [place] yesterday she fell after slipping.  Landed on the right side.  She was able to get in the car get over to my office get out of the car but had severe pain right tibial shaft.  I was not in the office at that time she went to the ER x-ray showed a tibial shaft fracture around the previous ankle fixation  She complains of pain but painful weightbearing and some mild knee discomfort  Injury date July 22, 2021  Past Medical History:  Diagnosis Date   Arthritis    knees   Full dentures    GERD (gastroesophageal reflux disease)    Hypertension    followed by pcp  (05-10-2019  per pt never had a stress test)   PMB (postmenopausal bleeding)    Type 2 diabetes mellitus ([place])    followed by pcp  (05-10-2019 check's cbg twice weekly,  fasting cbg-- 103)   Wears glasses     Past Surgical History:  Procedure Laterality Date   BREAST MASS EXCISION Left 04/1996   HYSTEROSCOPY WITH D & C N/A 05/11/2019   Procedure: DILATATION AND CURETTAGE /HYSTEROSCOPY;  Surgeon: Vanessa Kick, MD;  Location: [place];  Service: Gynecology;  Laterality: N/A;   ORIF ANKLE FRACTURE Right 07/06/2019   Procedure: OPEN REDUCTION INTERNAL FIXATION (ORIF) ANKLE FRACTURE;  Surgeon: Carole Civil, MD;  Location: [facility];  Service: Orthopedics;  Laterality: Right;   ROBOTIC ASSISTED TOTAL HYSTERECTOMY WITH BILATERAL SALPINGO OOPHERECTOMY Bilateral 06/15/2019   Procedure: XI ROBOTIC ASSISTED TOTAL HYSTERECTOMY GREATER THAN 250 GRAMS, WITH BILATERAL SALPINGO OOPHORECTOMY;  Surgeon: Everitt Amber, MD;  Location: [facility];  Service: Gynecology;  Laterality: Bilateral;   SENTINEL NODE BIOPSY N/A 06/15/2019   Procedure: SENTINEL NODE BIOPSY;  Surgeon: Everitt Amber, MD;  Location: [facility];  Service: Gynecology;  Laterality: N/A;    Family  History  Problem Relation Age of Onset   Uterine cancer Mother    Diabetes Mother    Hypertension Mother    Diabetes Father    Hypertension Brother     Social History:  reports that she has never smoked. She has never used smokeless tobacco. She reports that she does not drink alcohol and does not use drugs.  Allergies:  Allergies  Allergen Reactions   Other Swelling and Other (See Comments)    "lime jello causes swelling"    Medications: I have reviewed the patient's current medications.  Results for orders placed or performed during the hospital encounter of 07/22/21 (from the past 48 hour(s))  Comprehensive metabolic panel     Status: Abnormal   Collection Time: 07/22/21  7:08 PM  Result Value Ref Range   Sodium 136 135 - 145 mmol/L   Potassium 4.8 3.5 - 5.1 mmol/L   Chloride 106 98 - 111 mmol/L   CO2 23 22 - 32 mmol/L   Glucose, Bld 164 (H) 70 - 99 mg/dL    Comment: Glucose reference range applies only to samples taken after fasting for at least 8 hours.   BUN 47 (H) 8 - 23 mg/dL   Creatinine, Ser 1.80 (H) 0.44 - 1.00 mg/dL   Calcium 9.4 8.9 - 10.3 mg/dL   Total Protein 7.4 6.5 - 8.1 g/dL   Albumin 3.6 3.5 - 5.0 g/dL   AST 16  15 - 41 U/L   ALT 10 0 - 44 U/L   Alkaline Phosphatase 46 38 - 126 U/L   Total Bilirubin 0.2 (L) 0.3 - 1.2 mg/dL   GFR, Estimated 30 (L) >60 mL/min    Comment: (NOTE) Calculated using the CKD-EPI Creatinine Equation (2021)    Anion gap 7 5 - 15    Comment: Performed at [facility], [address] [place], [place] [address]  CBC     Status: Abnormal   Collection Time: 07/22/21  7:08 PM  Result Value Ref Range   WBC 9.3 4.0 - 10.5 K/uL   RBC 3.54 (L) 3.87 - 5.11 MIL/uL   Hemoglobin 10.5 (L) 12.0 - 15.0 g/dL   HCT 31.5 (L) 36.0 - 46.0 %   MCV 89.0 80.0 - 100.0 fL   MCH 29.7 26.0 - 34.0 pg   MCHC 33.3 30.0 - 36.0 g/dL   RDW 14.2 11.5 - 15.5 %   Platelets 216 150 - 400 K/uL   nRBC 0.0 0.0 - 0.2 %    Comment: Performed at [facility], [address], [place], [place] [address]  Protime-INR     Status: None   Collection Time: 07/22/21  7:08 PM  Result Value Ref Range   Prothrombin Time 13.0 11.4 - 15.2 seconds   INR 1.0 0.8 - 1.2    Comment: (NOTE) INR goal varies based on device and disease states. Performed at [facility], [address], [place],  [address]   Resp Panel by RT-PCR (Flu A&B, Covid) Nasopharyngeal Swab     Status: Abnormal   Collection Time: 07/22/21  8:26 PM   Specimen: Nasopharyngeal Swab; Nasopharyngeal(NP) swabs in vial transport medium  Result Value Ref Range   SARS Coronavirus 2 by RT PCR POSITIVE (A) NEGATIVE    Comment: (NOTE) SARS-CoV-2 target nucleic acids are DETECTED.  The SARS-CoV-2 RNA is generally detectable in upper respiratory specimens during the acute phase of infection. Positive results are indicative of the presence of the identified virus, but do not rule out bacterial infection or co-infection with other pathogens not detected by the test. Clinical correlation with patient history and other diagnostic information is necessary to determine patient infection status. The expected result is Negative.  Fact Sheet for Patients: EntrepreneurPulse.com.au  Fact Sheet for Healthcare Providers: IncredibleEmployment.be  This test is not yet approved or cleared by the [place] FDA and  has been authorized for detection and/or diagnosis of SARS-CoV-2 by FDA under an Emergency Use Authorization (EUA).  This EUA will remain in effect (meaning this test can be used) for the duration of  the COVID-19 declaration under Section 564(b)(1) of the A ct, 21 U.S.C. section 360bbb-3(b)(1), unless the authorization is terminated or revoked sooner.     Influenza A by PCR NEGATIVE NEGATIVE   Influenza B by PCR NEGATIVE NEGATIVE    Comment: (NOTE) The Xpert Xpress SARS-CoV-2/FLU/RSV plus assay is intended as an aid in the diagnosis of influenza  from Nasopharyngeal swab specimens and should not be used as a sole basis for treatment. Nasal washings and aspirates are unacceptable for Xpert Xpress SARS-CoV-2/FLU/RSV testing.  Fact Sheet for Patients: EntrepreneurPulse.com.au  Fact Sheet for Healthcare Providers: IncredibleEmployment.be  This test is not yet approved or cleared by the [place] FDA and has been authorized for detection and/or diagnosis of SARS-CoV-2 by FDA under an Emergency Use Authorization (EUA). This EUA will remain in effect (meaning this test can be used) for the duration of the  COVID-19 declaration under Section 564(b)(1) of the Act, 21 U.S.C. section 360bbb-3(b)(1), unless the authorization is terminated or revoked.  Performed at [facility], [address], [place], [place] [address]   Urinalysis, Routine w reflex microscopic Urine, Clean Catch     Status: Abnormal   Collection Time: 07/23/21  5:31 AM  Result Value Ref Range   Color, Urine YELLOW YELLOW   APPearance CLEAR CLEAR   Specific Gravity, Urine 1.012 1.005 - 1.030   pH 5.0 5.0 - 8.0   Glucose, UA NEGATIVE NEGATIVE mg/dL   Hgb urine dipstick MODERATE (A) NEGATIVE   Bilirubin Urine NEGATIVE NEGATIVE   Ketones, ur NEGATIVE NEGATIVE mg/dL   Protein, ur NEGATIVE NEGATIVE mg/dL   Nitrite NEGATIVE NEGATIVE   Leukocytes,Ua NEGATIVE NEGATIVE   RBC / HPF 11-20 0 - 5 RBC/hpf   WBC, UA 0-5 0 - 5 WBC/hpf   Bacteria, UA RARE (A) NONE SEEN   Squamous Epithelial / LPF 0-5 0 - 5    Comment: Performed at [facility], [address] [place], [place] [address]  Comprehensive metabolic panel     Status: Abnormal   Collection Time: 07/23/21  5:37 AM  Result Value Ref Range   Sodium 137 135 - 145 mmol/L   Potassium 4.4 3.5 - 5.1 mmol/L   Chloride 108 98 - 111 mmol/L   CO2 20 (L) 22 - 32 mmol/L   Glucose, Bld 151 (H) 70 - 99 mg/dL    Comment: Glucose reference range applies only to samples taken after fasting for  at least 8 hours.   BUN 44 (H) 8 - 23 mg/dL   Creatinine, Ser 1.79 (H) 0.44 - 1.00 mg/dL   Calcium 9.4 8.9 - 10.3 mg/dL   Total Protein 7.3 6.5 - 8.1 g/dL   Albumin 3.5 3.5 - 5.0 g/dL   AST 21 15 - 41 U/L   ALT 11 0 - 44 U/L   Alkaline Phosphatase 47 38 - 126 U/L   Total Bilirubin 0.4 0.3 - 1.2 mg/dL   GFR, Estimated 31 (L) >60 mL/min    Comment: (NOTE) Calculated using the CKD-EPI Creatinine Equation (2021)    Anion gap 9 5 - 15    Comment: Performed at [facility], [address] [place], [place] [address]  Magnesium     Status: None   Collection Time: 07/23/21  5:37 AM  Result Value Ref Range   Magnesium 1.8 1.7 - 2.4 mg/dL    Comment: Performed at [facility], [address] [place],  [address]  CBC WITH DIFFERENTIAL     Status: Abnormal   Collection Time: 07/23/21  5:37 AM  Result Value Ref Range   WBC 9.7 4.0 - 10.5 K/uL   RBC 3.52 (L) 3.87 - 5.11 MIL/uL   Hemoglobin 10.2 (L) 12.0 - 15.0 g/dL   HCT 31.0 (L) 36.0 - 46.0 %   MCV 88.1 80.0 - 100.0 fL   MCH 29.0 26.0 - 34.0 pg   MCHC 32.9 30.0 - 36.0 g/dL   RDW 14.5 11.5 - 15.5 %   Platelets 230 150 - 400 K/uL   nRBC 0.0 0.0 - 0.2 %   Neutrophils Relative % 66 %   Neutro Abs 6.5 1.7 - 7.7 K/uL   Lymphocytes Relative 21 %   Lymphs Abs 2.1 0.7 - 4.0 K/uL   Monocytes Relative 9 %   Monocytes Absolute 0.9 0.1 - 1.0 K/uL   Eosinophils Relative 2 %   Eosinophils Absolute 0.2 0.0 -  0.5 K/uL   Basophils Relative 1 %   Basophils Absolute 0.1 0.0 - 0.1 K/uL   Immature Granulocytes 1 %   Abs Immature Granulocytes 0.05 0.00 - 0.07 K/uL    Comment: Performed at [facility], [address], [place], [place] [address]  Type and screen [facility]     Status: None   Collection Time: 07/23/21  5:37 AM  Result Value Ref Range   ABO/RH(D) A NEG    Antibody Screen NEG    Sample Expiration      07/26/2021,2359 Performed at [facility], [address], [place], [place] [address]     DG Tibia/Fibula  Right  Result Date: 07/22/2021 CLINICAL DATA:  General RIGHT leg pain, ankle surgery 3 years ago EXAM: RIGHT TIBIA AND FIBULA - 2 VIEW COMPARISON:  06/12/2020 FINDINGS: Osseous demineralization. Knee and ankle joint alignments normal. Degenerative changes RIGHT knee. Postsurgical changes with two K-wires across medial malleolus and lateral plate and screws at distal fibula. The most superior of the syndesmotic screws is chronically fractured. Lucencies are seen surrounding the 2 syndesmotic screws, slightly increased. New minimally displaced oblique distal RIGHT tibial diaphyseal fracture identified. No definite acute RIGHT fibular fracture. No additional fracture, dislocation, or bone destruction. IMPRESSION: Postsurgical changes from prior distal fibular and medial malleolar ORIF. Minimally displaced acute oblique fracture of the distal RIGHT tibial diaphysis. One of the syndesmotic screws is chronically fractured and lucencies are seen surrounding the 2 syndesmotic screws increased since prior study. Electronically Signed   By: Lavonia Dana M.D.   On: 07/22/2021 17:02   Portable chest 1 View  Result Date: 07/23/2021 CLINICAL DATA:  Preoperative chest clearance for tibial fracture. EXAM: PORTABLE CHEST 1 VIEW COMPARISON:  Chest x-ray 07/05/2019. FINDINGS: The heart size and mediastinal contours are within normal limits. Both lungs are clear. The visualized skeletal structures are unremarkable. IMPRESSION: No active disease. Electronically Signed   By: Ronney Asters M.D.   On: 07/23/2021 00:51    Review of Systems Blood pressure (!) 148/61, pulse 89, temperature 98.3 F (36.8 C), temperature source Oral, resp. rate 20, height 5\' 8"  (1.727 m), weight 120 kg, SpO2 96 %. Physical Exam  General appearance the patient is large in size.  Endomorphic  Mental status awake and alert oriented x3 mood affect normal  Right lower extremity Skin is intact Leg has good alignment neurovascular function to the  right foot Tenderness over the mid tibia at the fracture site Knee range of motion and toes are moving normally Compartments are soft  Or other 3 extremities are well aligned with no tenderness instability or loss of muscle tone  Assessment/Plan: X-rays show previous hardware lateral plate syndesmosis fixation to K wires medially with an oblique fracture comminuted nondisplaced minimally angulated right tibial shaft  COVID positive  Physical therapy, toe-touch weightbearing, cast after 24 hours    Arther Abbott 07/23/2021, 7:41 AM

## 2021-07-24 DIAGNOSIS — S82301A Unspecified fracture of lower end of right tibia, initial encounter for closed fracture: Secondary | ICD-10-CM

## 2021-07-24 LAB — GLUCOSE, CAPILLARY
Glucose-Capillary: 135 mg/dL — ABNORMAL HIGH (ref 70–99)
Glucose-Capillary: 152 mg/dL — ABNORMAL HIGH (ref 70–99)

## 2021-07-24 NOTE — TOC Transition Note (Signed)
Transition of Care [facility]) - CM/SW Discharge Note   Patient Details  Name: Nicole Sampson MRN: [id number] Date of Birth: [birth year]  Transition of Care [facility]) CM/SW Contact:  Ihor Gully, LCSW Phone Number: 07/24/2021, 11:45 AM   Clinical Narrative:    Patient from home. Admitted for tibia fracture. Referred to HHPT. Discussed [place] providers. Referred to [facility].    Final next level of care: [place] Barriers to Discharge: No Barriers Identified   Patient Goals and CMS Choice        Discharge Placement                       Discharge Plan and Services                          HH Arranged: PT HH Agency: [place] Date [place]: 07/24/21 Time [place]: 1859 Representative spoke with at [place]: Tommi Rumps  Social Determinants of Health ([place]) Interventions     Readmission Risk Interventions No flowsheet data found.

## 2021-07-24 NOTE — Progress Notes (Incomplete)
Nsg Discharge Note  Admit Date:  07/22/2021 Discharge date: 07/24/2021   Nicole Sampson to be D/C'd Home per MD order.  AVS completed.  Copy for chart, and copy for patient signed, and dated. Patient/caregiver able to verbalize understanding.  Discharge Medication: Allergies as of 07/24/2021       Reactions   Other Swelling, Other (See Comments)   "lime jello causes swelling"        Medication List     TAKE these medications    ascorbic acid 500 MG tablet Commonly known as: VITAMIN C Take 500 mg by mouth daily.   aspirin EC 81 MG tablet Take 81 mg by mouth every other day.   CALCIUM + D PO Take 1 tablet by mouth daily.   hydrochlorothiazide 12.5 MG tablet Commonly known as: HYDRODIURIL Take 12.5 mg by mouth daily.   losartan 50 MG tablet Commonly known as: COZAAR Take 50 mg by mouth daily.   metFORMIN 500 MG tablet Commonly known as: GLUCOPHAGE Take 500 mg by mouth daily with breakfast.   multivitamin tablet Take 1 tablet by mouth daily.   rosuvastatin 5 MG tablet Commonly known as: CRESTOR Take 5 mg by mouth once a week.        Discharge Assessment: Vitals:   07/23/21 2115 07/24/21 0507  BP: 139/64 (!) 164/85  Pulse: 86 94  Resp: 17 16  Temp: 98.3 F (36.8 C) 98.2 F (36.8 C)  SpO2: 99% 97%   Skin clean, dry and intact without evidence of skin break down, no evidence of skin tears noted. IV catheter discontinued intact. Site without signs and symptoms of complications - no redness or edema noted at insertion site, patient denies c/o pain - only slight tenderness at site.  Dressing with slight pressure applied.  D/c Instructions-Education: Discharge instructions given to patient/family with verbalized understanding. D/c education completed with patient/family including follow up instructions, medication list, d/c activities limitations if indicated, with other d/c instructions as indicated by MD - patient able to verbalize understanding, all  questions fully answered. Patient instructed to return to ED, call 911, or call MD for any changes in condition.  Patient escorted via [place], and D/C home via private auto.  Clovis Fredrickson, LPN 02/07/2059 1:56 PM

## 2021-07-24 NOTE — Progress Notes (Signed)
Patient ID: Nicole Sampson, female   DOB: 11/30/1950, 71 y.o.   MRN: [id number]  Fracture rt tibia   No swelling compartments soft   Sh leg cast applied   D/c toe touch WB   Fu 2 weeks   Had + covid test but no symptoms

## 2021-07-24 NOTE — Discharge Summary (Signed)
Physician Discharge Summary  Nicole Sampson ZOX:[id number] DOB: 12/30/1950 DOA: 07/22/2021  PCP: Nicanor Bake C  Admit date: 07/22/2021 Discharge date: 07/24/2021  Admitted From: Home Disposition:  Home   Recommendations for Outpatient Follow-up:  Follow up with PCP in 1-2 weeks Please obtain BMP/CBC in one week   Home Health:HHPT   Discharge Condition: Stable CODE STATUS: FULL Diet recommendation: Heart Healthy   Brief/Interim Summary: 71 year old female with a history of hypertension, diabetes mellitus type 2, GERD, endometrial carcinoma status post TAH with BSO presenting after slipping and falling at [place] lobby on 07/22/2021.  The patient actually drove to Dr. Ruthe Mannan office and got out of the car but had severe right lower extremity pain.  She was instructed go to the emergency department for further evaluation.  X-rays of the right tibia/fib showed a minimal displaced acute fracture of the distal right tibia diaphysis.  Patient denies fevers, chills, headache, chest pain, dyspnea, nausea, vomiting, diarrhea, abdominal pain, dysuria, hematuria, hematochezia, and melena. The patient was admitted for further evaluation and treatment.  Orthopedics was consulted.  The patient was also noted to be incidentally positive for COVID-19.  Discharge Diagnoses:  Acute right tibial fracture -Appreciate orthopedic consult -Case discussed with Dr. Neil Crouch not require surgery -07/24/21 Cast placed by Dr. Dedra Skeens up in office 2 wks -Toe-touch weightbearing -Judicious opioids -PT evaluation>>HHPT   COVID-19 -Notably, patient COVID-19 positive from home test 07/02/2021 -She took 5 days Paxlovid at that time -She is asymptomatic -Stable on room air   CKD stage IIIb -Baseline creatinine 1.5-1.8 -Holding losartan--restart after d/c -Hold HCTZ--restart after dc   Diabetes mellitus type 2 -Holding metformin>>restart after d/c -NovoLog sliding scale -1/11 Check  A1c--7.0   Hyperlipidemia -Continue statin   Morbid Obesity BMI 40.22 -lifestyle modification   Discharge Instructions   Allergies as of 07/24/2021       Reactions   Other Swelling, Other (See Comments)   "lime jello causes swelling"        Medication List     TAKE these medications    ascorbic acid 500 MG tablet Commonly known as: VITAMIN C Take 500 mg by mouth daily.   aspirin EC 81 MG tablet Take 81 mg by mouth every other day.   CALCIUM + D PO Take 1 tablet by mouth daily.   hydrochlorothiazide 12.5 MG tablet Commonly known as: HYDRODIURIL Take 12.5 mg by mouth daily.   losartan 50 MG tablet Commonly known as: COZAAR Take 50 mg by mouth daily.   metFORMIN 500 MG tablet Commonly known as: GLUCOPHAGE Take 500 mg by mouth daily with breakfast.   multivitamin tablet Take 1 tablet by mouth daily.   rosuvastatin 5 MG tablet Commonly known as: CRESTOR Take 5 mg by mouth once a week.        Allergies  Allergen Reactions   Other Swelling and Other (See Comments)    "lime jello causes swelling"    Consultations: Ortho--[facility]   Procedures/Studies: DG Tibia/Fibula Right  Result Date: 07/22/2021 CLINICAL DATA:  General RIGHT leg pain, ankle surgery 3 years ago EXAM: RIGHT TIBIA AND FIBULA - 2 VIEW COMPARISON:  06/12/2020 FINDINGS: Osseous demineralization. Knee and ankle joint alignments normal. Degenerative changes RIGHT knee. Postsurgical changes with two K-wires across medial malleolus and lateral plate and screws at distal fibula. The most superior of the syndesmotic screws is chronically fractured. Lucencies are seen surrounding the 2 syndesmotic screws, slightly increased. New minimally displaced oblique distal RIGHT tibial diaphyseal fracture identified. No definite acute  RIGHT fibular fracture. No additional fracture, dislocation, or bone destruction. IMPRESSION: Postsurgical changes from prior distal fibular and medial malleolar ORIF.  Minimally displaced acute oblique fracture of the distal RIGHT tibial diaphysis. One of the syndesmotic screws is chronically fractured and lucencies are seen surrounding the 2 syndesmotic screws increased since prior study. Electronically Signed   By: Lavonia Dana M.D.   On: 07/22/2021 17:02   Portable chest 1 View  Result Date: 07/23/2021 CLINICAL DATA:  Preoperative chest clearance for tibial fracture. EXAM: PORTABLE CHEST 1 VIEW COMPARISON:  Chest x-ray 07/05/2019. FINDINGS: The heart size and mediastinal contours are within normal limits. Both lungs are clear. The visualized skeletal structures are unremarkable. IMPRESSION: No active disease. Electronically Signed   By: Ronney Asters M.D.   On: 07/23/2021 00:51        Discharge Exam: Vitals:   07/23/21 2115 07/24/21 0507  BP: 139/64 (!) 164/85  Pulse: 86 94  Resp: 17 16  Temp: 98.3 F (36.8 C) 98.2 F (36.8 C)  SpO2: 99% 97%   Vitals:   07/23/21 0934 07/23/21 1350 07/23/21 2115 07/24/21 0507  BP: (!) 149/62 (!) 142/72 139/64 (!) 164/85  Pulse: 93 91 86 94  Resp: 20 18 17 16   Temp: 98.9 F (37.2 C) 98.5 F (36.9 C) 98.3 F (36.8 C) 98.2 F (36.8 C)  TempSrc: Oral Oral Oral Oral  SpO2: 98% 100% 99% 97%  Weight:      Height:        General: Pt is alert, awake, not in acute distress Cardiovascular: RRR, S1/S2 +, no rubs, no gallops Respiratory: CTA bilaterally, no wheezing, no rhonchi Abdominal: Soft, NT, ND, bowel sounds + Extremities: no edema, no cyanosis   The results of significant diagnostics from this hospitalization (including imaging, microbiology, ancillary and laboratory) are listed below for reference.    Significant Diagnostic Studies: DG Tibia/Fibula Right  Result Date: 07/22/2021 CLINICAL DATA:  General RIGHT leg pain, ankle surgery 3 years ago EXAM: RIGHT TIBIA AND FIBULA - 2 VIEW COMPARISON:  06/12/2020 FINDINGS: Osseous demineralization. Knee and ankle joint alignments normal. Degenerative changes  RIGHT knee. Postsurgical changes with two K-wires across medial malleolus and lateral plate and screws at distal fibula. The most superior of the syndesmotic screws is chronically fractured. Lucencies are seen surrounding the 2 syndesmotic screws, slightly increased. New minimally displaced oblique distal RIGHT tibial diaphyseal fracture identified. No definite acute RIGHT fibular fracture. No additional fracture, dislocation, or bone destruction. IMPRESSION: Postsurgical changes from prior distal fibular and medial malleolar ORIF. Minimally displaced acute oblique fracture of the distal RIGHT tibial diaphysis. One of the syndesmotic screws is chronically fractured and lucencies are seen surrounding the 2 syndesmotic screws increased since prior study. Electronically Signed   By: Lavonia Dana M.D.   On: 07/22/2021 17:02   Portable chest 1 View  Result Date: 07/23/2021 CLINICAL DATA:  Preoperative chest clearance for tibial fracture. EXAM: PORTABLE CHEST 1 VIEW COMPARISON:  Chest x-ray 07/05/2019. FINDINGS: The heart size and mediastinal contours are within normal limits. Both lungs are clear. The visualized skeletal structures are unremarkable. IMPRESSION: No active disease. Electronically Signed   By: Ronney Asters M.D.   On: 07/23/2021 00:51    Microbiology: Recent Results (from the past 240 hour(s))  Resp Panel by RT-PCR (Flu A&B, Covid) Nasopharyngeal Swab     Status: Abnormal   Collection Time: 07/22/21  8:26 PM   Specimen: Nasopharyngeal Swab; Nasopharyngeal(NP) swabs in vial transport medium  Result Value Ref Range  Status   SARS Coronavirus 2 by RT PCR POSITIVE (A) NEGATIVE Final    Comment: (NOTE) SARS-CoV-2 target nucleic acids are DETECTED.  The SARS-CoV-2 RNA is generally detectable in upper respiratory specimens during the acute phase of infection. Positive results are indicative of the presence of the identified virus, but do not rule out bacterial infection or co-infection with other  pathogens not detected by the test. Clinical correlation with patient history and other diagnostic information is necessary to determine patient infection status. The expected result is Negative.  Fact Sheet for Patients: EntrepreneurPulse.com.au  Fact Sheet for Healthcare Providers: IncredibleEmployment.be  This test is not yet approved or cleared by the [place] FDA and  has been authorized for detection and/or diagnosis of SARS-CoV-2 by FDA under an Emergency Use Authorization (EUA).  This EUA will remain in effect (meaning this test can be used) for the duration of  the COVID-19 declaration under Section 564(b)(1) of the A ct, 21 U.S.C. section 360bbb-3(b)(1), unless the authorization is terminated or revoked sooner.     Influenza A by PCR NEGATIVE NEGATIVE Final   Influenza B by PCR NEGATIVE NEGATIVE Final    Comment: (NOTE) The Xpert Xpress SARS-CoV-2/FLU/RSV plus assay is intended as an aid in the diagnosis of influenza from Nasopharyngeal swab specimens and should not be used as a sole basis for treatment. Nasal washings and aspirates are unacceptable for Xpert Xpress SARS-CoV-2/FLU/RSV testing.  Fact Sheet for Patients: EntrepreneurPulse.com.au  Fact Sheet for Healthcare Providers: IncredibleEmployment.be  This test is not yet approved or cleared by the [place] FDA and has been authorized for detection and/or diagnosis of SARS-CoV-2 by FDA under an Emergency Use Authorization (EUA). This EUA will remain in effect (meaning this test can be used) for the duration of the COVID-19 declaration under Section 564(b)(1) of the Act, 21 U.S.C. section 360bbb-3(b)(1), unless the authorization is terminated or revoked.  Performed at [facility], [address] [place], [place] [address]      Labs: Basic Metabolic Panel: Recent Labs  Lab 07/22/21 1908 07/23/21 0537  NA 136 137  K 4.8  4.4  CL 106 108  CO2 23 20*  GLUCOSE 164* 151*  BUN 47* 44*  CREATININE 1.80* 1.79*  CALCIUM 9.4 9.4  MG  --  1.8   Liver Function Tests: Recent Labs  Lab 07/22/21 1908 07/23/21 0537  AST 16 21  ALT 10 11  ALKPHOS 46 47  BILITOT 0.2* 0.4  PROT 7.4 7.3  ALBUMIN 3.6 3.5   No results for input(s): LIPASE, AMYLASE in the last 168 hours. No results for input(s): AMMONIA in the last 168 hours. CBC: Recent Labs  Lab 07/22/21 1908 07/23/21 0537  WBC 9.3 9.7  NEUTROABS  --  6.5  HGB 10.5* 10.2*  HCT 31.5* 31.0*  MCV 89.0 88.1  PLT 216 230   Cardiac Enzymes: No results for input(s): CKTOTAL, CKMB, CKMBINDEX, TROPONINI in the last 168 hours. BNP: Invalid input(s): POCBNP CBG: Recent Labs  Lab 07/23/21 1108 07/23/21 1622 07/23/21 2115 07/24/21 0720 07/24/21 1109  GLUCAP 200* 124* 129* 135* 152*    Time coordinating discharge:  36 minutes  Signed:  Orson Eva, DO [facility] Pager: [phone] 07/24/2021, 2:06 PM

## 2021-07-28 DIAGNOSIS — Z7984 Long term (current) use of oral hypoglycemic drugs: Secondary | ICD-10-CM | POA: Diagnosis not present

## 2021-07-28 DIAGNOSIS — W19XXXD Unspecified fall, subsequent encounter: Secondary | ICD-10-CM | POA: Diagnosis not present

## 2021-07-28 DIAGNOSIS — K219 Gastro-esophageal reflux disease without esophagitis: Secondary | ICD-10-CM | POA: Diagnosis not present

## 2021-07-28 DIAGNOSIS — U071 COVID-19: Secondary | ICD-10-CM | POA: Diagnosis not present

## 2021-07-28 DIAGNOSIS — Z9181 History of falling: Secondary | ICD-10-CM | POA: Diagnosis not present

## 2021-07-28 DIAGNOSIS — N1832 Chronic kidney disease, stage 3b: Secondary | ICD-10-CM | POA: Diagnosis not present

## 2021-07-28 DIAGNOSIS — S82201D Unspecified fracture of shaft of right tibia, subsequent encounter for closed fracture with routine healing: Secondary | ICD-10-CM | POA: Diagnosis not present

## 2021-07-28 DIAGNOSIS — I129 Hypertensive chronic kidney disease with stage 1 through stage 4 chronic kidney disease, or unspecified chronic kidney disease: Secondary | ICD-10-CM | POA: Diagnosis not present

## 2021-07-28 DIAGNOSIS — Z7982 Long term (current) use of aspirin: Secondary | ICD-10-CM | POA: Diagnosis not present

## 2021-07-28 DIAGNOSIS — Z6839 Body mass index (BMI) 39.0-39.9, adult: Secondary | ICD-10-CM | POA: Diagnosis not present

## 2021-07-28 DIAGNOSIS — E1122 Type 2 diabetes mellitus with diabetic chronic kidney disease: Secondary | ICD-10-CM | POA: Diagnosis not present

## 2021-07-28 DIAGNOSIS — Z8544 Personal history of malignant neoplasm of other female genital organs: Secondary | ICD-10-CM | POA: Diagnosis not present

## 2021-07-28 DIAGNOSIS — E785 Hyperlipidemia, unspecified: Secondary | ICD-10-CM | POA: Diagnosis not present

## 2021-08-05 DIAGNOSIS — S82201D Unspecified fracture of shaft of right tibia, subsequent encounter for closed fracture with routine healing: Secondary | ICD-10-CM | POA: Diagnosis not present

## 2021-08-05 DIAGNOSIS — I129 Hypertensive chronic kidney disease with stage 1 through stage 4 chronic kidney disease, or unspecified chronic kidney disease: Secondary | ICD-10-CM | POA: Diagnosis not present

## 2021-08-05 DIAGNOSIS — E785 Hyperlipidemia, unspecified: Secondary | ICD-10-CM | POA: Diagnosis not present

## 2021-08-05 DIAGNOSIS — N1832 Chronic kidney disease, stage 3b: Secondary | ICD-10-CM | POA: Diagnosis not present

## 2021-08-05 DIAGNOSIS — E1122 Type 2 diabetes mellitus with diabetic chronic kidney disease: Secondary | ICD-10-CM | POA: Diagnosis not present

## 2021-08-05 DIAGNOSIS — U071 COVID-19: Secondary | ICD-10-CM | POA: Diagnosis not present

## 2021-08-06 DIAGNOSIS — Z9889 Other specified postprocedural states: Secondary | ICD-10-CM | POA: Insufficient documentation

## 2021-08-07 ENCOUNTER — Other Ambulatory Visit: Payer: Self-pay

## 2021-08-07 ENCOUNTER — Ambulatory Visit (INDEPENDENT_AMBULATORY_CARE_PROVIDER_SITE_OTHER): Payer: Medicare Other | Admitting: Orthopedic Surgery

## 2021-08-07 ENCOUNTER — Encounter: Payer: Self-pay | Admitting: Orthopedic Surgery

## 2021-08-07 ENCOUNTER — Ambulatory Visit: Payer: Medicare Other

## 2021-08-07 VITALS — Ht 68.0 in | Wt 264.0 lb

## 2021-08-07 DIAGNOSIS — S82891D Other fracture of right lower leg, subsequent encounter for closed fracture with routine healing: Secondary | ICD-10-CM | POA: Diagnosis not present

## 2021-08-07 DIAGNOSIS — Z8781 Personal history of (healed) traumatic fracture: Secondary | ICD-10-CM | POA: Diagnosis not present

## 2021-08-07 DIAGNOSIS — Z9889 Other specified postprocedural states: Secondary | ICD-10-CM | POA: Diagnosis not present

## 2021-08-07 NOTE — Progress Notes (Signed)
Chief Complaint  Patient presents with   Fracture    Rt tibial fx DOI 07/22/21   Encounter Diagnoses  Name Primary?   S/P ORIF (open reduction internal fixation) fracture Ankle Right 07/06/19    Closed fracture of right ankle with routine healing, subsequent encounter ORIF 07/06/19 Yes    Fracture care  Right tibia fracture above previous ankle ORIF  Treatment cast  Other complaints pain right shoulder reached out to put something down felt some pain tried some Advil ibuprofen notices decreased range of motion  Right shoulder she can still raise her arm it just hurts  Recommend exercises to be taught by the therapist  X-rays show nondisplaced no angulation oblique fracture above the hardware in the tibia  Cast check intact  Recommend x-ray in 4 weeks

## 2021-08-08 DIAGNOSIS — U071 COVID-19: Secondary | ICD-10-CM | POA: Diagnosis not present

## 2021-08-08 DIAGNOSIS — I129 Hypertensive chronic kidney disease with stage 1 through stage 4 chronic kidney disease, or unspecified chronic kidney disease: Secondary | ICD-10-CM | POA: Diagnosis not present

## 2021-08-08 DIAGNOSIS — E785 Hyperlipidemia, unspecified: Secondary | ICD-10-CM | POA: Diagnosis not present

## 2021-08-08 DIAGNOSIS — S82201D Unspecified fracture of shaft of right tibia, subsequent encounter for closed fracture with routine healing: Secondary | ICD-10-CM | POA: Diagnosis not present

## 2021-08-08 DIAGNOSIS — N1832 Chronic kidney disease, stage 3b: Secondary | ICD-10-CM | POA: Diagnosis not present

## 2021-08-08 DIAGNOSIS — E1122 Type 2 diabetes mellitus with diabetic chronic kidney disease: Secondary | ICD-10-CM | POA: Diagnosis not present

## 2021-08-12 DIAGNOSIS — I129 Hypertensive chronic kidney disease with stage 1 through stage 4 chronic kidney disease, or unspecified chronic kidney disease: Secondary | ICD-10-CM | POA: Diagnosis not present

## 2021-08-12 DIAGNOSIS — E785 Hyperlipidemia, unspecified: Secondary | ICD-10-CM | POA: Diagnosis not present

## 2021-08-12 DIAGNOSIS — N1832 Chronic kidney disease, stage 3b: Secondary | ICD-10-CM | POA: Diagnosis not present

## 2021-08-12 DIAGNOSIS — E1122 Type 2 diabetes mellitus with diabetic chronic kidney disease: Secondary | ICD-10-CM | POA: Diagnosis not present

## 2021-08-12 DIAGNOSIS — U071 COVID-19: Secondary | ICD-10-CM | POA: Diagnosis not present

## 2021-08-12 DIAGNOSIS — S82201D Unspecified fracture of shaft of right tibia, subsequent encounter for closed fracture with routine healing: Secondary | ICD-10-CM | POA: Diagnosis not present

## 2021-08-14 DIAGNOSIS — I129 Hypertensive chronic kidney disease with stage 1 through stage 4 chronic kidney disease, or unspecified chronic kidney disease: Secondary | ICD-10-CM | POA: Diagnosis not present

## 2021-08-14 DIAGNOSIS — E785 Hyperlipidemia, unspecified: Secondary | ICD-10-CM | POA: Diagnosis not present

## 2021-08-14 DIAGNOSIS — S82201D Unspecified fracture of shaft of right tibia, subsequent encounter for closed fracture with routine healing: Secondary | ICD-10-CM | POA: Diagnosis not present

## 2021-08-14 DIAGNOSIS — U071 COVID-19: Secondary | ICD-10-CM | POA: Diagnosis not present

## 2021-08-14 DIAGNOSIS — N1832 Chronic kidney disease, stage 3b: Secondary | ICD-10-CM | POA: Diagnosis not present

## 2021-08-14 DIAGNOSIS — E1122 Type 2 diabetes mellitus with diabetic chronic kidney disease: Secondary | ICD-10-CM | POA: Diagnosis not present

## 2021-08-19 DIAGNOSIS — U071 COVID-19: Secondary | ICD-10-CM | POA: Diagnosis not present

## 2021-08-19 DIAGNOSIS — I129 Hypertensive chronic kidney disease with stage 1 through stage 4 chronic kidney disease, or unspecified chronic kidney disease: Secondary | ICD-10-CM | POA: Diagnosis not present

## 2021-08-19 DIAGNOSIS — E1122 Type 2 diabetes mellitus with diabetic chronic kidney disease: Secondary | ICD-10-CM | POA: Diagnosis not present

## 2021-08-19 DIAGNOSIS — E785 Hyperlipidemia, unspecified: Secondary | ICD-10-CM | POA: Diagnosis not present

## 2021-08-19 DIAGNOSIS — N1832 Chronic kidney disease, stage 3b: Secondary | ICD-10-CM | POA: Diagnosis not present

## 2021-08-19 DIAGNOSIS — S82201D Unspecified fracture of shaft of right tibia, subsequent encounter for closed fracture with routine healing: Secondary | ICD-10-CM | POA: Diagnosis not present

## 2021-08-21 DIAGNOSIS — I129 Hypertensive chronic kidney disease with stage 1 through stage 4 chronic kidney disease, or unspecified chronic kidney disease: Secondary | ICD-10-CM | POA: Diagnosis not present

## 2021-08-21 DIAGNOSIS — N1832 Chronic kidney disease, stage 3b: Secondary | ICD-10-CM | POA: Diagnosis not present

## 2021-08-21 DIAGNOSIS — U071 COVID-19: Secondary | ICD-10-CM | POA: Diagnosis not present

## 2021-08-21 DIAGNOSIS — E785 Hyperlipidemia, unspecified: Secondary | ICD-10-CM | POA: Diagnosis not present

## 2021-08-21 DIAGNOSIS — E1122 Type 2 diabetes mellitus with diabetic chronic kidney disease: Secondary | ICD-10-CM | POA: Diagnosis not present

## 2021-08-21 DIAGNOSIS — S82201D Unspecified fracture of shaft of right tibia, subsequent encounter for closed fracture with routine healing: Secondary | ICD-10-CM | POA: Diagnosis not present

## 2021-08-26 DIAGNOSIS — U071 COVID-19: Secondary | ICD-10-CM | POA: Diagnosis not present

## 2021-08-26 DIAGNOSIS — E1122 Type 2 diabetes mellitus with diabetic chronic kidney disease: Secondary | ICD-10-CM | POA: Diagnosis not present

## 2021-08-26 DIAGNOSIS — S82201D Unspecified fracture of shaft of right tibia, subsequent encounter for closed fracture with routine healing: Secondary | ICD-10-CM | POA: Diagnosis not present

## 2021-08-26 DIAGNOSIS — I129 Hypertensive chronic kidney disease with stage 1 through stage 4 chronic kidney disease, or unspecified chronic kidney disease: Secondary | ICD-10-CM | POA: Diagnosis not present

## 2021-08-26 DIAGNOSIS — N1832 Chronic kidney disease, stage 3b: Secondary | ICD-10-CM | POA: Diagnosis not present

## 2021-08-26 DIAGNOSIS — E785 Hyperlipidemia, unspecified: Secondary | ICD-10-CM | POA: Diagnosis not present

## 2021-08-27 DIAGNOSIS — I129 Hypertensive chronic kidney disease with stage 1 through stage 4 chronic kidney disease, or unspecified chronic kidney disease: Secondary | ICD-10-CM | POA: Diagnosis not present

## 2021-08-27 DIAGNOSIS — E785 Hyperlipidemia, unspecified: Secondary | ICD-10-CM | POA: Diagnosis not present

## 2021-08-27 DIAGNOSIS — N1832 Chronic kidney disease, stage 3b: Secondary | ICD-10-CM | POA: Diagnosis not present

## 2021-08-27 DIAGNOSIS — Z6839 Body mass index (BMI) 39.0-39.9, adult: Secondary | ICD-10-CM | POA: Diagnosis not present

## 2021-08-27 DIAGNOSIS — Z7984 Long term (current) use of oral hypoglycemic drugs: Secondary | ICD-10-CM | POA: Diagnosis not present

## 2021-08-27 DIAGNOSIS — Z9181 History of falling: Secondary | ICD-10-CM | POA: Diagnosis not present

## 2021-08-27 DIAGNOSIS — U071 COVID-19: Secondary | ICD-10-CM | POA: Diagnosis not present

## 2021-08-27 DIAGNOSIS — S82201D Unspecified fracture of shaft of right tibia, subsequent encounter for closed fracture with routine healing: Secondary | ICD-10-CM | POA: Diagnosis not present

## 2021-08-27 DIAGNOSIS — E1122 Type 2 diabetes mellitus with diabetic chronic kidney disease: Secondary | ICD-10-CM | POA: Diagnosis not present

## 2021-08-27 DIAGNOSIS — K219 Gastro-esophageal reflux disease without esophagitis: Secondary | ICD-10-CM | POA: Diagnosis not present

## 2021-08-27 DIAGNOSIS — Z8544 Personal history of malignant neoplasm of other female genital organs: Secondary | ICD-10-CM | POA: Diagnosis not present

## 2021-08-27 DIAGNOSIS — W19XXXD Unspecified fall, subsequent encounter: Secondary | ICD-10-CM | POA: Diagnosis not present

## 2021-08-27 DIAGNOSIS — Z7982 Long term (current) use of aspirin: Secondary | ICD-10-CM | POA: Diagnosis not present

## 2021-09-02 DIAGNOSIS — E785 Hyperlipidemia, unspecified: Secondary | ICD-10-CM | POA: Diagnosis not present

## 2021-09-02 DIAGNOSIS — U071 COVID-19: Secondary | ICD-10-CM | POA: Diagnosis not present

## 2021-09-02 DIAGNOSIS — S82244A Nondisplaced spiral fracture of shaft of right tibia, initial encounter for closed fracture: Secondary | ICD-10-CM | POA: Insufficient documentation

## 2021-09-02 DIAGNOSIS — S82201D Unspecified fracture of shaft of right tibia, subsequent encounter for closed fracture with routine healing: Secondary | ICD-10-CM | POA: Diagnosis not present

## 2021-09-02 DIAGNOSIS — N1832 Chronic kidney disease, stage 3b: Secondary | ICD-10-CM | POA: Diagnosis not present

## 2021-09-02 DIAGNOSIS — E1122 Type 2 diabetes mellitus with diabetic chronic kidney disease: Secondary | ICD-10-CM | POA: Diagnosis not present

## 2021-09-02 DIAGNOSIS — I129 Hypertensive chronic kidney disease with stage 1 through stage 4 chronic kidney disease, or unspecified chronic kidney disease: Secondary | ICD-10-CM | POA: Diagnosis not present

## 2021-09-04 ENCOUNTER — Other Ambulatory Visit: Payer: Self-pay

## 2021-09-04 ENCOUNTER — Ambulatory Visit: Payer: Medicare Other

## 2021-09-04 ENCOUNTER — Encounter: Payer: Self-pay | Admitting: Orthopedic Surgery

## 2021-09-04 ENCOUNTER — Ambulatory Visit (INDEPENDENT_AMBULATORY_CARE_PROVIDER_SITE_OTHER): Payer: Medicare Other | Admitting: Orthopedic Surgery

## 2021-09-04 DIAGNOSIS — S82244D Nondisplaced spiral fracture of shaft of right tibia, subsequent encounter for closed fracture with routine healing: Secondary | ICD-10-CM

## 2021-09-04 NOTE — Progress Notes (Signed)
Chief Complaint  Patient presents with   Leg Injury right leg    Tibia fracture 07/22/21 improving states cast is tight     Nicole Sampson is 6 weeks status post application of short leg cast right leg  She is doing well except she feels the cast is tight  She is diabetic  We will take the cast off  The x-ray looks good  She has a broken screw on the syndesmosis fixation but the fracture site looks good the other hardware from the previous ORIF of the ankle looks great  We will place her in a long or a tall cam walker weight-bear as tolerated with walker and then x-ray again in 6 weeks

## 2021-09-04 NOTE — Patient Instructions (Signed)
You can weight-bear as tolerated in the walking boot with the walker  You do not have to wear the cam walker when you are sleeping  Return in 6 weeks call us if any problems

## 2021-09-09 DIAGNOSIS — E1122 Type 2 diabetes mellitus with diabetic chronic kidney disease: Secondary | ICD-10-CM | POA: Diagnosis not present

## 2021-09-09 DIAGNOSIS — I129 Hypertensive chronic kidney disease with stage 1 through stage 4 chronic kidney disease, or unspecified chronic kidney disease: Secondary | ICD-10-CM | POA: Diagnosis not present

## 2021-09-09 DIAGNOSIS — S82201D Unspecified fracture of shaft of right tibia, subsequent encounter for closed fracture with routine healing: Secondary | ICD-10-CM | POA: Diagnosis not present

## 2021-09-09 DIAGNOSIS — N1832 Chronic kidney disease, stage 3b: Secondary | ICD-10-CM | POA: Diagnosis not present

## 2021-09-09 DIAGNOSIS — E785 Hyperlipidemia, unspecified: Secondary | ICD-10-CM | POA: Diagnosis not present

## 2021-09-09 DIAGNOSIS — U071 COVID-19: Secondary | ICD-10-CM | POA: Diagnosis not present

## 2021-10-13 ENCOUNTER — Encounter: Payer: Self-pay | Admitting: Orthopedic Surgery

## 2021-10-13 ENCOUNTER — Ambulatory Visit: Payer: Medicare Other

## 2021-10-13 ENCOUNTER — Ambulatory Visit (INDEPENDENT_AMBULATORY_CARE_PROVIDER_SITE_OTHER): Payer: Medicare Other | Admitting: Orthopedic Surgery

## 2021-10-13 DIAGNOSIS — S82244D Nondisplaced spiral fracture of shaft of right tibia, subsequent encounter for closed fracture with routine healing: Secondary | ICD-10-CM | POA: Diagnosis not present

## 2021-10-13 NOTE — Progress Notes (Signed)
FOLLOW UP  ? ?Encounter Diagnosis  ?Name Primary?  ? Closed nondisplaced spiral fracture of shaft of right tibia with routine healing, subsequent encounter Yes  ? ? ? ?Chief Complaint  ?Patient presents with  ? fracture care  ?  RT Tib/Fib ?DOI 07/22/21  ? ? ? ?Nicole Sampson is 71 years old this is a follow-up visit for her right tib fracture above her previous ORIF of the ankle.  She is doing well walking in a cam walker without any pain ? ?However on clinical exam she had some tenderness near the fracture site so we decided to keep her in the boot another 3 weeks at which time she will follow-up for clinical exam no x-ray needed ? ?She is allowed to take the boot off while in the house ?

## 2021-10-23 ENCOUNTER — Telehealth: Payer: Self-pay | Admitting: *Deleted

## 2021-10-23 NOTE — Telephone Encounter (Signed)
Patient called for a follow up appt with Dr Delsa Sale on 6/14 ?

## 2021-11-03 ENCOUNTER — Ambulatory Visit (INDEPENDENT_AMBULATORY_CARE_PROVIDER_SITE_OTHER): Payer: Medicare Other | Admitting: Orthopedic Surgery

## 2021-11-03 DIAGNOSIS — Z9889 Other specified postprocedural states: Secondary | ICD-10-CM

## 2021-11-03 DIAGNOSIS — S82244D Nondisplaced spiral fracture of shaft of right tibia, subsequent encounter for closed fracture with routine healing: Secondary | ICD-10-CM | POA: Diagnosis not present

## 2021-11-03 DIAGNOSIS — Z8781 Personal history of (healed) traumatic fracture: Secondary | ICD-10-CM

## 2021-11-03 NOTE — Progress Notes (Signed)
FOLLOW UP  ? ?Encounter Diagnoses  ?Name Primary?  ? Closed nondisplaced spiral fracture of shaft of right tibia with routine healing, subsequent encounter Yes  ? S/P ORIF (open reduction internal fixation) fracture Ankle Right 07/06/19   ? ? ? ?Chief Complaint  ?Patient presents with  ? Post-op Follow-up  ?  Closed nondisplaced spiral fracture of shaft of right tibia with routine healing, subsequent encounter DOI 07/22/21  ? ? ? ?Nicole Sampson is doing well.  She did a trial with her boot off out of the house several times including going to church as well as while she was in the house.  She had no problems ? ?Her clinical exam no pain or tenderness ? ?I am releasing her to normal activity without need for cam walking boot ?

## 2021-12-23 ENCOUNTER — Encounter: Payer: Self-pay | Admitting: Obstetrics & Gynecology

## 2021-12-24 ENCOUNTER — Encounter: Payer: Self-pay | Admitting: Obstetrics & Gynecology

## 2021-12-24 ENCOUNTER — Inpatient Hospital Stay: Payer: Medicare Other | Attending: Obstetrics & Gynecology | Admitting: Obstetrics & Gynecology

## 2021-12-24 ENCOUNTER — Other Ambulatory Visit: Payer: Self-pay

## 2021-12-24 VITALS — BP 175/71 | HR 82 | Temp 98.0°F | Resp 18 | Ht 68.0 in | Wt 243.0 lb

## 2021-12-24 DIAGNOSIS — Z8542 Personal history of malignant neoplasm of other parts of uterus: Secondary | ICD-10-CM | POA: Diagnosis not present

## 2021-12-24 DIAGNOSIS — C541 Malignant neoplasm of endometrium: Secondary | ICD-10-CM

## 2021-12-24 NOTE — Progress Notes (Signed)
Follow Up Note: Gyn-Onc  Nicole Sampson 71 y.o. female  CC: She presents for a f/u visit   HPI: The oncology history was reviewed.  Interval History: She denies any vaginal bleeding, abdominal/pelvic pain, cough, lethargy or increasing abdominal girth.   Review of Systems  Review of Systems  Constitutional:  Negative for malaise/fatigue and weight loss.  Respiratory:  Negative for shortness of breath and wheezing.   Cardiovascular:  Negative for chest pain and leg swelling.  Gastrointestinal:  Negative for abdominal pain, blood in stool, constipation, nausea and vomiting.  Genitourinary:  Negative for dysuria, frequency, hematuria and urgency.  Musculoskeletal:  Negative for joint pain and myalgias.  Neurological:  Negative for weakness.  Psychiatric/Behavioral:  Negative for depression. The patient does not have insomnia.    Current medications, allergy, social history, past surgical history, past medical history, family history were all reviewed.    Vitals:  BP (!) 175/71   Pulse 82   Temp 98 F (36.7 C)   Resp 18   Ht '5\' 8"'  (1.727 m)   Wt 243 lb (110.2 kg)   SpO2 100%   BMI 36.95 kg/m    Physical Exam:  Physical Exam Exam conducted with a chaperone present.  Constitutional:      General: She is not in acute distress. Cardiovascular:     Rate and Rhythm: Normal rate and regular rhythm.  Pulmonary:     Effort: Pulmonary effort is normal.     Breath sounds: Normal breath sounds. No wheezing or rhonchi.  Abdominal:     Palpations: Abdomen is soft.     Tenderness: There is no abdominal tenderness. There is no right CVA tenderness or left CVA tenderness.     Hernia: No hernia is present.  Genitourinary:    General: Normal vulva.     Urethra: No urethral lesion.     Vagina: No lesions. No bleeding.  Synechiae palpated at the apex Musculoskeletal:     Cervical back: Neck supple.     Right lower leg: No edema.     Left lower leg: No edema.  Lymphadenopathy:      Upper Body:     Right upper body: No supraclavicular adenopathy.     Left upper body: No supraclavicular adenopathy.     Lower Body: No right inguinal adenopathy. No left inguinal adenopathy.  Skin:    Findings: No rash.  Neurological:     Mental Status: She is oriented to person, place, and time.   Assessment/Plan:  Endometrial cancer [facility]) [name]  is a 71 y.o.  A history of stage IA grade 1 endometrial adenocarcinoma s/p robotic staging on 06/15/19. MSI High with loss of MLH1. Declined genetics evaluation.   Negative symptom review, normal exam.  No evidence of recurrence    Continue follow-up every 6 months for 5 years in accordance with NCCN guidelines.   She will return in June every year and Dr Harrington Challenger in December each year.    Lahoma Crocker, MD

## 2021-12-24 NOTE — Patient Instructions (Signed)
Return in 1 yr 

## 2021-12-24 NOTE — Assessment & Plan Note (Addendum)
[name]  is a 71 y.o.  A history of stage IA grade 1 endometrial adenocarcinoma s/p robotic staging on 06/15/19. MSI High with loss of MLH1. Declined genetics evaluation.  Negative symptom review, normal exam.  No evidence of recurrence   Continue follow-up every 6 months for 5 years in accordance with NCCN guidelines.  She will return in June every year and Dr Harrington Challenger in December each year.

## 2022-01-05 DIAGNOSIS — I10 Essential (primary) hypertension: Secondary | ICD-10-CM | POA: Diagnosis not present

## 2022-01-05 DIAGNOSIS — E1165 Type 2 diabetes mellitus with hyperglycemia: Secondary | ICD-10-CM | POA: Diagnosis not present

## 2022-01-05 DIAGNOSIS — N183 Chronic kidney disease, stage 3 unspecified: Secondary | ICD-10-CM | POA: Diagnosis not present

## 2022-01-05 DIAGNOSIS — E7800 Pure hypercholesterolemia, unspecified: Secondary | ICD-10-CM | POA: Diagnosis not present

## 2022-01-05 DIAGNOSIS — Z299 Encounter for prophylactic measures, unspecified: Secondary | ICD-10-CM | POA: Diagnosis not present

## 2022-01-05 DIAGNOSIS — E1122 Type 2 diabetes mellitus with diabetic chronic kidney disease: Secondary | ICD-10-CM | POA: Diagnosis not present

## 2022-02-04 DIAGNOSIS — Z789 Other specified health status: Secondary | ICD-10-CM | POA: Diagnosis not present

## 2022-02-04 DIAGNOSIS — Z6841 Body Mass Index (BMI) 40.0 and over, adult: Secondary | ICD-10-CM | POA: Diagnosis not present

## 2022-02-04 DIAGNOSIS — Z7189 Other specified counseling: Secondary | ICD-10-CM | POA: Diagnosis not present

## 2022-02-04 DIAGNOSIS — Z79899 Other long term (current) drug therapy: Secondary | ICD-10-CM | POA: Diagnosis not present

## 2022-02-04 DIAGNOSIS — Z0000 Encounter for general adult medical examination without abnormal findings: Secondary | ICD-10-CM | POA: Diagnosis not present

## 2022-02-04 DIAGNOSIS — Z1331 Encounter for screening for depression: Secondary | ICD-10-CM | POA: Diagnosis not present

## 2022-02-04 DIAGNOSIS — R5383 Other fatigue: Secondary | ICD-10-CM | POA: Diagnosis not present

## 2022-02-04 DIAGNOSIS — Z299 Encounter for prophylactic measures, unspecified: Secondary | ICD-10-CM | POA: Diagnosis not present

## 2022-02-04 DIAGNOSIS — Z1339 Encounter for screening examination for other mental health and behavioral disorders: Secondary | ICD-10-CM | POA: Diagnosis not present

## 2022-02-04 DIAGNOSIS — E7800 Pure hypercholesterolemia, unspecified: Secondary | ICD-10-CM | POA: Diagnosis not present

## 2022-02-04 DIAGNOSIS — I10 Essential (primary) hypertension: Secondary | ICD-10-CM | POA: Diagnosis not present

## 2022-03-20 DIAGNOSIS — Z23 Encounter for immunization: Secondary | ICD-10-CM | POA: Diagnosis not present

## 2022-04-27 DIAGNOSIS — Z6841 Body Mass Index (BMI) 40.0 and over, adult: Secondary | ICD-10-CM | POA: Diagnosis not present

## 2022-04-27 DIAGNOSIS — E1165 Type 2 diabetes mellitus with hyperglycemia: Secondary | ICD-10-CM | POA: Diagnosis not present

## 2022-04-27 DIAGNOSIS — Z299 Encounter for prophylactic measures, unspecified: Secondary | ICD-10-CM | POA: Diagnosis not present

## 2022-04-27 DIAGNOSIS — G5702 Lesion of sciatic nerve, left lower limb: Secondary | ICD-10-CM | POA: Diagnosis not present

## 2022-04-27 DIAGNOSIS — I10 Essential (primary) hypertension: Secondary | ICD-10-CM | POA: Diagnosis not present

## 2022-04-27 DIAGNOSIS — N1832 Chronic kidney disease, stage 3b: Secondary | ICD-10-CM | POA: Diagnosis not present

## 2022-04-28 DIAGNOSIS — Z23 Encounter for immunization: Secondary | ICD-10-CM | POA: Diagnosis not present

## 2022-05-11 DIAGNOSIS — N1832 Chronic kidney disease, stage 3b: Secondary | ICD-10-CM | POA: Diagnosis not present

## 2022-05-26 DIAGNOSIS — Z79899 Other long term (current) drug therapy: Secondary | ICD-10-CM | POA: Diagnosis not present

## 2022-05-27 DIAGNOSIS — E7800 Pure hypercholesterolemia, unspecified: Secondary | ICD-10-CM | POA: Diagnosis not present

## 2022-05-27 DIAGNOSIS — N1832 Chronic kidney disease, stage 3b: Secondary | ICD-10-CM | POA: Diagnosis not present

## 2022-05-27 DIAGNOSIS — E1122 Type 2 diabetes mellitus with diabetic chronic kidney disease: Secondary | ICD-10-CM | POA: Diagnosis not present

## 2022-05-27 DIAGNOSIS — Z299 Encounter for prophylactic measures, unspecified: Secondary | ICD-10-CM | POA: Diagnosis not present

## 2022-05-27 DIAGNOSIS — I10 Essential (primary) hypertension: Secondary | ICD-10-CM | POA: Diagnosis not present

## 2022-07-15 DIAGNOSIS — M25552 Pain in left hip: Secondary | ICD-10-CM | POA: Diagnosis not present

## 2022-07-15 DIAGNOSIS — M25551 Pain in right hip: Secondary | ICD-10-CM | POA: Diagnosis not present

## 2022-08-10 DIAGNOSIS — N1832 Chronic kidney disease, stage 3b: Secondary | ICD-10-CM | POA: Diagnosis not present

## 2022-08-12 DIAGNOSIS — E1122 Type 2 diabetes mellitus with diabetic chronic kidney disease: Secondary | ICD-10-CM | POA: Diagnosis not present

## 2022-08-12 DIAGNOSIS — E1165 Type 2 diabetes mellitus with hyperglycemia: Secondary | ICD-10-CM | POA: Diagnosis not present

## 2022-08-12 DIAGNOSIS — Z299 Encounter for prophylactic measures, unspecified: Secondary | ICD-10-CM | POA: Diagnosis not present

## 2022-08-12 DIAGNOSIS — D692 Other nonthrombocytopenic purpura: Secondary | ICD-10-CM | POA: Diagnosis not present

## 2022-08-12 DIAGNOSIS — I10 Essential (primary) hypertension: Secondary | ICD-10-CM | POA: Diagnosis not present

## 2022-09-14 ENCOUNTER — Other Ambulatory Visit (HOSPITAL_COMMUNITY): Payer: Self-pay | Admitting: Nephrology

## 2022-09-14 DIAGNOSIS — N189 Chronic kidney disease, unspecified: Secondary | ICD-10-CM | POA: Diagnosis not present

## 2022-09-14 DIAGNOSIS — D638 Anemia in other chronic diseases classified elsewhere: Secondary | ICD-10-CM | POA: Diagnosis not present

## 2022-09-14 DIAGNOSIS — E1122 Type 2 diabetes mellitus with diabetic chronic kidney disease: Secondary | ICD-10-CM

## 2022-09-14 DIAGNOSIS — E785 Hyperlipidemia, unspecified: Secondary | ICD-10-CM | POA: Diagnosis not present

## 2022-09-14 DIAGNOSIS — I10 Essential (primary) hypertension: Secondary | ICD-10-CM

## 2022-09-14 DIAGNOSIS — I129 Hypertensive chronic kidney disease with stage 1 through stage 4 chronic kidney disease, or unspecified chronic kidney disease: Secondary | ICD-10-CM | POA: Diagnosis not present

## 2022-09-14 DIAGNOSIS — Z6839 Body mass index (BMI) 39.0-39.9, adult: Secondary | ICD-10-CM | POA: Diagnosis not present

## 2022-09-21 DIAGNOSIS — N189 Chronic kidney disease, unspecified: Secondary | ICD-10-CM | POA: Diagnosis not present

## 2022-09-21 DIAGNOSIS — D638 Anemia in other chronic diseases classified elsewhere: Secondary | ICD-10-CM | POA: Diagnosis not present

## 2022-09-21 DIAGNOSIS — E785 Hyperlipidemia, unspecified: Secondary | ICD-10-CM | POA: Diagnosis not present

## 2022-09-21 DIAGNOSIS — E1122 Type 2 diabetes mellitus with diabetic chronic kidney disease: Secondary | ICD-10-CM | POA: Diagnosis not present

## 2022-09-21 DIAGNOSIS — Z6839 Body mass index (BMI) 39.0-39.9, adult: Secondary | ICD-10-CM | POA: Diagnosis not present

## 2022-09-21 DIAGNOSIS — I129 Hypertensive chronic kidney disease with stage 1 through stage 4 chronic kidney disease, or unspecified chronic kidney disease: Secondary | ICD-10-CM | POA: Diagnosis not present

## 2022-09-22 ENCOUNTER — Ambulatory Visit (HOSPITAL_COMMUNITY)
Admission: RE | Admit: 2022-09-22 | Discharge: 2022-09-22 | Disposition: A | Discharge status: Home / Self Care | Payer: Medicare Other | Source: Ambulatory Visit | Attending: Nephrology | Admitting: Nephrology

## 2022-09-22 DIAGNOSIS — I10 Essential (primary) hypertension: Secondary | ICD-10-CM

## 2022-09-22 DIAGNOSIS — E1122 Type 2 diabetes mellitus with diabetic chronic kidney disease: Secondary | ICD-10-CM | POA: Diagnosis not present

## 2022-09-22 DIAGNOSIS — D638 Anemia in other chronic diseases classified elsewhere: Secondary | ICD-10-CM

## 2022-09-22 DIAGNOSIS — N181 Chronic kidney disease, stage 1: Secondary | ICD-10-CM | POA: Insufficient documentation

## 2022-09-22 DIAGNOSIS — N281 Cyst of kidney, acquired: Secondary | ICD-10-CM | POA: Diagnosis not present

## 2022-09-22 DIAGNOSIS — N189 Chronic kidney disease, unspecified: Secondary | ICD-10-CM | POA: Diagnosis not present

## 2022-09-25 DIAGNOSIS — Z1231 Encounter for screening mammogram for malignant neoplasm of breast: Secondary | ICD-10-CM | POA: Diagnosis not present

## 2022-10-01 ENCOUNTER — Other Ambulatory Visit: Payer: Self-pay | Admitting: Obstetrics and Gynecology

## 2022-10-01 DIAGNOSIS — R928 Other abnormal and inconclusive findings on diagnostic imaging of breast: Secondary | ICD-10-CM

## 2022-10-15 DIAGNOSIS — Z6839 Body mass index (BMI) 39.0-39.9, adult: Secondary | ICD-10-CM | POA: Diagnosis not present

## 2022-10-15 DIAGNOSIS — E1129 Type 2 diabetes mellitus with other diabetic kidney complication: Secondary | ICD-10-CM | POA: Diagnosis not present

## 2022-10-15 DIAGNOSIS — R809 Proteinuria, unspecified: Secondary | ICD-10-CM | POA: Diagnosis not present

## 2022-10-15 DIAGNOSIS — D638 Anemia in other chronic diseases classified elsewhere: Secondary | ICD-10-CM | POA: Diagnosis not present

## 2022-10-15 DIAGNOSIS — R778 Other specified abnormalities of plasma proteins: Secondary | ICD-10-CM | POA: Diagnosis not present

## 2022-10-15 DIAGNOSIS — E1122 Type 2 diabetes mellitus with diabetic chronic kidney disease: Secondary | ICD-10-CM | POA: Diagnosis not present

## 2022-10-15 DIAGNOSIS — N189 Chronic kidney disease, unspecified: Secondary | ICD-10-CM | POA: Diagnosis not present

## 2022-10-15 DIAGNOSIS — N281 Cyst of kidney, acquired: Secondary | ICD-10-CM | POA: Diagnosis not present

## 2022-10-15 DIAGNOSIS — I129 Hypertensive chronic kidney disease with stage 1 through stage 4 chronic kidney disease, or unspecified chronic kidney disease: Secondary | ICD-10-CM | POA: Diagnosis not present

## 2022-10-17 IMAGING — DX DG TIBIA/FIBULA 2V*R*
4 series · 4 of 4 positions shown · non-contrast
Comparison: 06/12/2020

CLINICAL DATA: General RIGHT leg pain, ankle surgery 3 years ago

EXAM:
RIGHT TIBIA AND FIBULA - 2 VIEW

[tibia ap (1 of 2)]
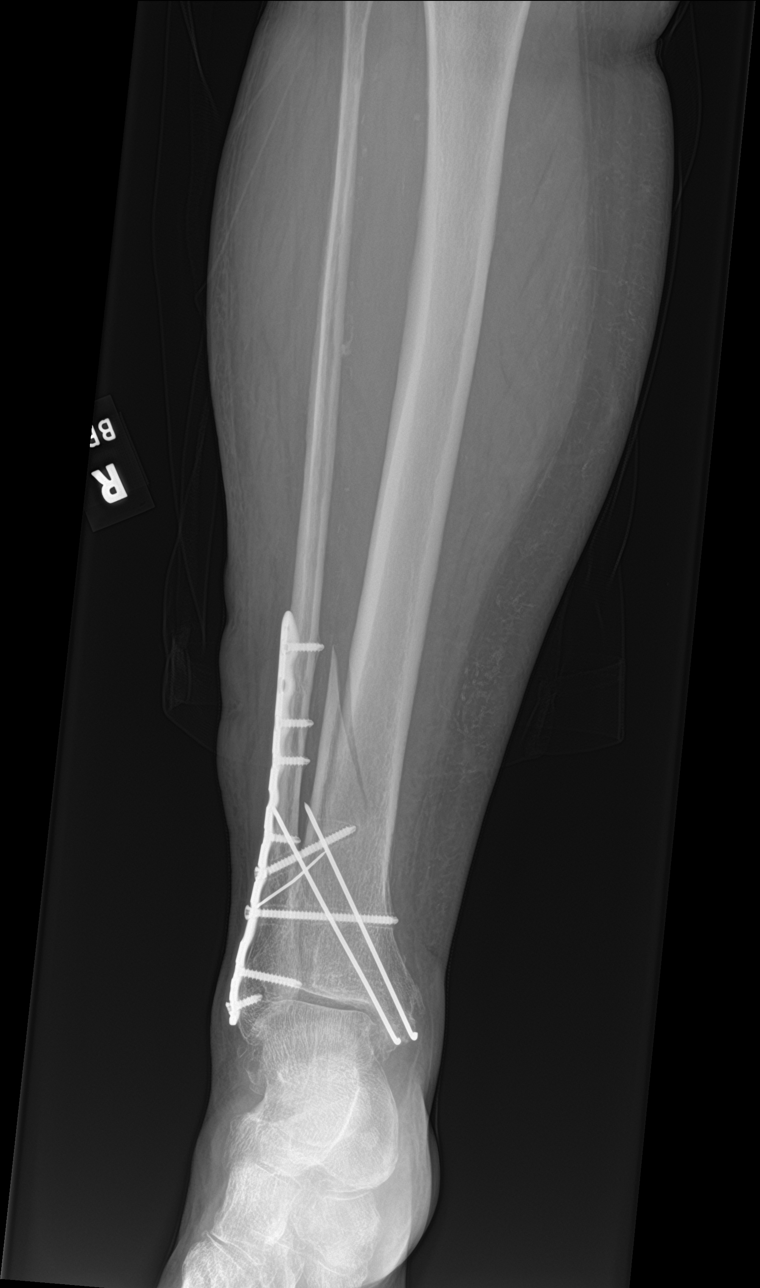

[tibia ap (2 of 2)]
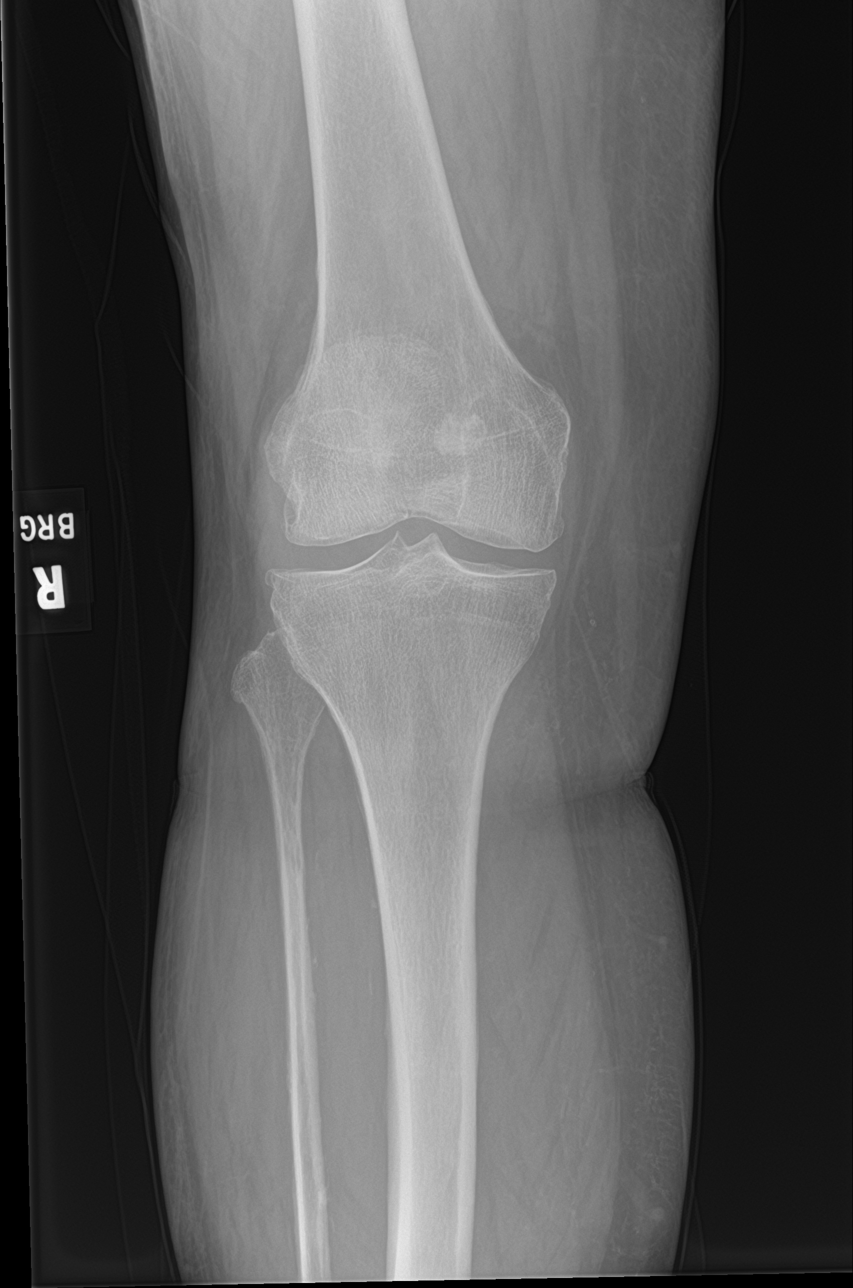

[tibia lat (1 of 2)]
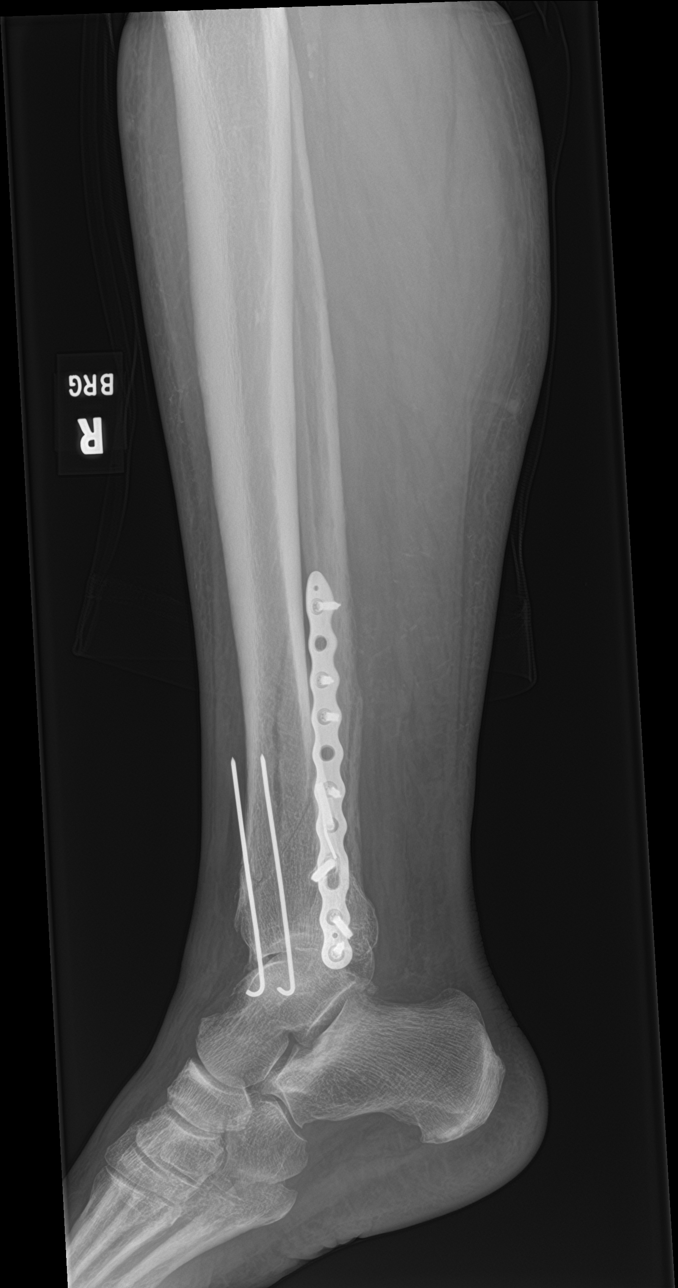

[tibia lat (2 of 2)]
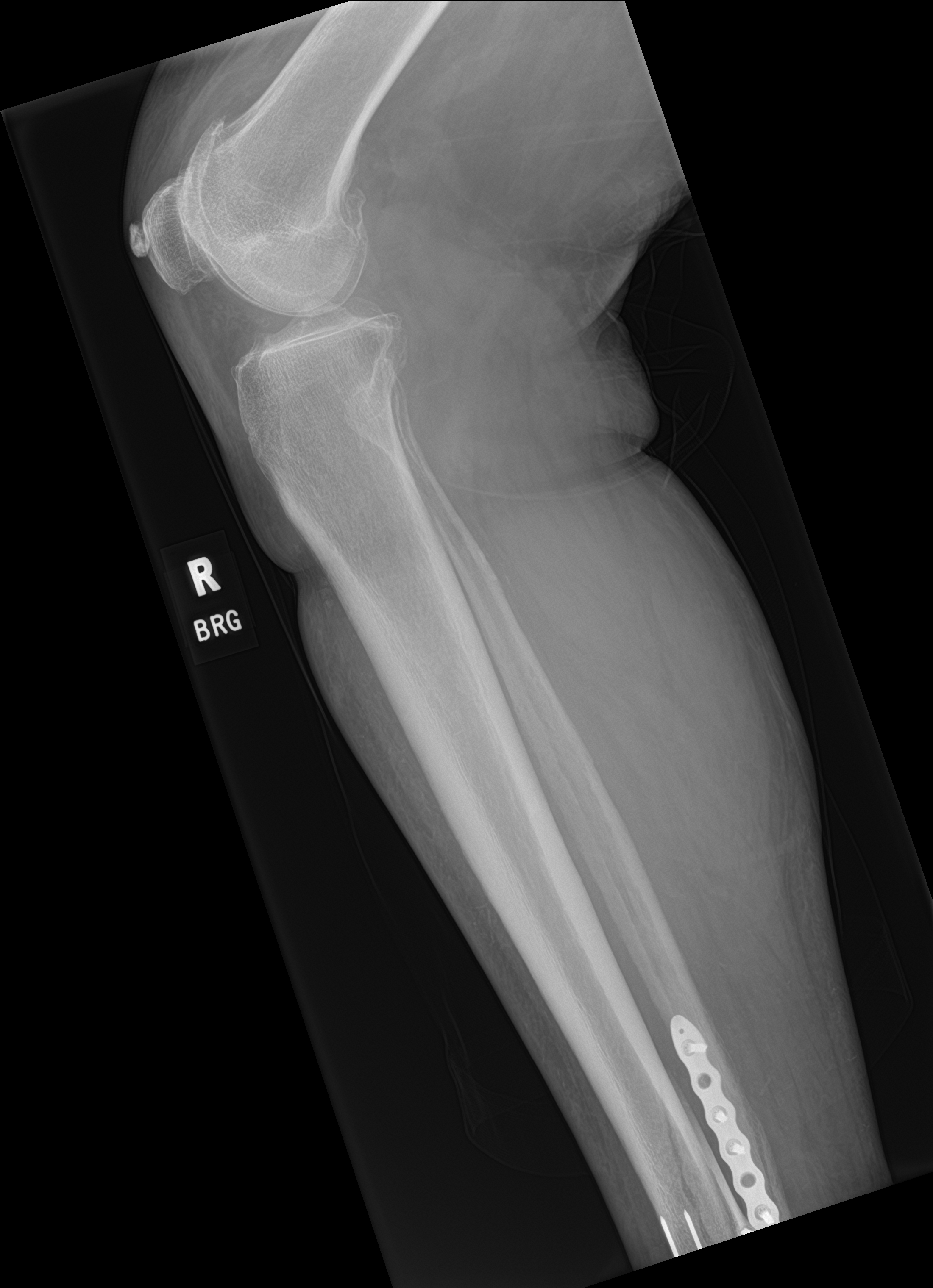

[4 of 4 positions shown; findings below may reference images not displayed]

FINDINGS: Osseous demineralization.

Knee and ankle joint alignments normal.

Degenerative changes RIGHT knee.

Postsurgical changes with two K-wires across medial malleolus and
lateral plate and screws at distal fibula.

The most superior of the syndesmotic screws is chronically
fractured.

Lucencies are seen surrounding the 2 syndesmotic screws, slightly
increased.

New minimally displaced oblique distal RIGHT tibial diaphyseal
fracture identified.

No definite acute RIGHT fibular fracture.

No additional fracture, dislocation, or bone destruction.
IMPRESSION: Postsurgical changes from prior distal fibular and medial malleolar
ORIF.

Minimally displaced acute oblique fracture of the distal RIGHT
tibial diaphysis.

One of the syndesmotic screws is chronically fractured and lucencies
are seen surrounding the 2 syndesmotic screws increased since prior
study.

## 2022-10-19 ENCOUNTER — Ambulatory Visit
Admission: RE | Admit: 2022-10-19 | Discharge: 2022-10-19 | Disposition: A | Discharge status: Home / Self Care | Payer: Medicare Other | Source: Ambulatory Visit | Attending: Obstetrics and Gynecology | Admitting: Obstetrics and Gynecology

## 2022-10-19 ENCOUNTER — Other Ambulatory Visit: Payer: Self-pay | Admitting: Obstetrics and Gynecology

## 2022-10-19 ENCOUNTER — Ambulatory Visit: Payer: Medicare Other

## 2022-10-19 DIAGNOSIS — N6310 Unspecified lump in the right breast, unspecified quadrant: Secondary | ICD-10-CM

## 2022-10-19 DIAGNOSIS — R928 Other abnormal and inconclusive findings on diagnostic imaging of breast: Secondary | ICD-10-CM

## 2022-10-19 DIAGNOSIS — N6452 Nipple discharge: Secondary | ICD-10-CM | POA: Diagnosis not present

## 2022-10-22 ENCOUNTER — Ambulatory Visit
Admission: RE | Admit: 2022-10-22 | Discharge: 2022-10-22 | Disposition: A | Discharge status: Home / Self Care | Payer: Medicare Other | Source: Ambulatory Visit | Attending: Obstetrics and Gynecology | Admitting: Obstetrics and Gynecology

## 2022-10-22 DIAGNOSIS — N6310 Unspecified lump in the right breast, unspecified quadrant: Secondary | ICD-10-CM

## 2022-10-22 DIAGNOSIS — R928 Other abnormal and inconclusive findings on diagnostic imaging of breast: Secondary | ICD-10-CM

## 2022-10-22 DIAGNOSIS — N6011 Diffuse cystic mastopathy of right breast: Secondary | ICD-10-CM | POA: Diagnosis not present

## 2022-10-22 HISTORY — PX: BREAST BIOPSY: SHX20

## 2022-10-29 ENCOUNTER — Telehealth: Payer: Self-pay | Admitting: *Deleted

## 2022-10-29 NOTE — Telephone Encounter (Signed)
Patient called and schedueld a follow up appt with Dr Tamela Oddi

## 2022-11-18 DIAGNOSIS — I10 Essential (primary) hypertension: Secondary | ICD-10-CM | POA: Diagnosis not present

## 2022-11-18 DIAGNOSIS — I152 Hypertension secondary to endocrine disorders: Secondary | ICD-10-CM | POA: Diagnosis not present

## 2022-11-18 DIAGNOSIS — M19079 Primary osteoarthritis, unspecified ankle and foot: Secondary | ICD-10-CM | POA: Diagnosis not present

## 2022-11-18 DIAGNOSIS — E1165 Type 2 diabetes mellitus with hyperglycemia: Secondary | ICD-10-CM | POA: Diagnosis not present

## 2022-11-18 DIAGNOSIS — Z299 Encounter for prophylactic measures, unspecified: Secondary | ICD-10-CM | POA: Diagnosis not present

## 2022-11-18 DIAGNOSIS — E1159 Type 2 diabetes mellitus with other circulatory complications: Secondary | ICD-10-CM | POA: Diagnosis not present

## 2022-12-14 ENCOUNTER — Encounter: Payer: Self-pay | Admitting: Obstetrics & Gynecology

## 2022-12-15 NOTE — Assessment & Plan Note (Signed)
[name]  is [age]  y.o.  A history of stage IA grade 1 endometrial adenocarcinoma s/p robotic staging on 06/15/19. MSI High with loss of MLH1. Declined genetics evaluation.   Negative symptom review, normal exam.  No evidence of recurrence     Continue follow-up every 6 months for 5 years in accordance with NCCN guidelines.   She will return in June every year and Dr Tenny Craw in December each year.

## 2022-12-15 NOTE — Progress Notes (Unsigned)
Follow Up Note: Gyn-Onc  Nicole Sampson 72 y.o. female  CC: She presents for a f/u visit   HPI: The oncology history was reviewed.  Interval History: She denies any vaginal bleeding, abdominal/pelvic pain, cough, lethargy or increasing abdominal girth.   Review of Systems  Review of Systems  Constitutional:  Negative for malaise/fatigue and weight loss.  Respiratory:  Negative for shortness of breath and wheezing.   Cardiovascular:  Negative for chest pain and leg swelling.  Gastrointestinal:  Negative for abdominal pain, blood in stool, constipation, nausea and vomiting.  Genitourinary:  Negative for dysuria, frequency, hematuria and urgency.  Musculoskeletal:  Negative for joint pain and myalgias.  Neurological:  Negative for weakness.  Psychiatric/Behavioral:  Negative for depression. The patient does not have insomnia.    Current medications, allergy, social history, past surgical history, past medical history, family history were all reviewed.    Vitals:  There were no vitals taken for this visit.   Physical Exam:  Physical Exam Exam conducted with a chaperone present.  Constitutional:      General: She is not in acute distress. Cardiovascular:     Rate and Rhythm: Normal rate and regular rhythm.  Pulmonary:     Effort: Pulmonary effort is normal.     Breath sounds: Normal breath sounds. No wheezing or rhonchi.  Abdominal:     Palpations: Abdomen is soft.     Tenderness: There is no abdominal tenderness. There is no right CVA tenderness or left CVA tenderness.     Hernia: No hernia is present.  Genitourinary:    General: Normal vulva.     Urethra: No urethral lesion.     Vagina: No lesions. No bleeding.  Synechiae palpated at the apex Musculoskeletal:     Cervical back: Neck supple.     Right lower leg: No edema.     Left lower leg: No edema.  Lymphadenopathy:     Upper Body:     Right upper body: No supraclavicular adenopathy.     Left upper body: No  supraclavicular adenopathy.     Lower Body: No right inguinal adenopathy. No left inguinal adenopathy.  Skin:    Findings: No rash.  Neurological:     Mental Status: She is oriented to person, place, and time.   Assessment/Plan:  No problem-specific Assessment & Plan notes found for this encounter.     Antionette Char, MD

## 2022-12-16 ENCOUNTER — Other Ambulatory Visit: Payer: Self-pay

## 2022-12-16 ENCOUNTER — Encounter: Payer: Self-pay | Admitting: Obstetrics & Gynecology

## 2022-12-16 ENCOUNTER — Inpatient Hospital Stay: Payer: Medicare Other | Attending: Obstetrics & Gynecology | Admitting: Obstetrics & Gynecology

## 2022-12-16 VITALS — BP 148/69 | HR 91 | Temp 97.5°F | Resp 18 | Ht 66.54 in | Wt 261.8 lb

## 2022-12-16 DIAGNOSIS — C541 Malignant neoplasm of endometrium: Secondary | ICD-10-CM

## 2022-12-16 DIAGNOSIS — Z8542 Personal history of malignant neoplasm of other parts of uterus: Secondary | ICD-10-CM | POA: Diagnosis not present

## 2022-12-16 NOTE — Patient Instructions (Signed)
Return in 1 year ?

## 2022-12-29 DIAGNOSIS — E119 Type 2 diabetes mellitus without complications: Secondary | ICD-10-CM | POA: Diagnosis not present

## 2022-12-29 DIAGNOSIS — H2513 Age-related nuclear cataract, bilateral: Secondary | ICD-10-CM | POA: Diagnosis not present

## 2023-01-15 DIAGNOSIS — D631 Anemia in chronic kidney disease: Secondary | ICD-10-CM | POA: Diagnosis not present

## 2023-01-15 DIAGNOSIS — N183 Chronic kidney disease, stage 3 unspecified: Secondary | ICD-10-CM | POA: Diagnosis not present

## 2023-01-15 DIAGNOSIS — R809 Proteinuria, unspecified: Secondary | ICD-10-CM | POA: Diagnosis not present

## 2023-01-21 DIAGNOSIS — N184 Chronic kidney disease, stage 4 (severe): Secondary | ICD-10-CM | POA: Diagnosis not present

## 2023-01-21 DIAGNOSIS — I129 Hypertensive chronic kidney disease with stage 1 through stage 4 chronic kidney disease, or unspecified chronic kidney disease: Secondary | ICD-10-CM | POA: Diagnosis not present

## 2023-01-21 DIAGNOSIS — E1122 Type 2 diabetes mellitus with diabetic chronic kidney disease: Secondary | ICD-10-CM | POA: Diagnosis not present

## 2023-01-21 DIAGNOSIS — R809 Proteinuria, unspecified: Secondary | ICD-10-CM | POA: Diagnosis not present

## 2023-02-09 DIAGNOSIS — I10 Essential (primary) hypertension: Secondary | ICD-10-CM | POA: Diagnosis not present

## 2023-02-09 DIAGNOSIS — Z7189 Other specified counseling: Secondary | ICD-10-CM | POA: Diagnosis not present

## 2023-02-09 DIAGNOSIS — Z1339 Encounter for screening examination for other mental health and behavioral disorders: Secondary | ICD-10-CM | POA: Diagnosis not present

## 2023-02-09 DIAGNOSIS — R5383 Other fatigue: Secondary | ICD-10-CM | POA: Diagnosis not present

## 2023-02-09 DIAGNOSIS — Z0000 Encounter for general adult medical examination without abnormal findings: Secondary | ICD-10-CM | POA: Diagnosis not present

## 2023-02-09 DIAGNOSIS — Z299 Encounter for prophylactic measures, unspecified: Secondary | ICD-10-CM | POA: Diagnosis not present

## 2023-02-09 DIAGNOSIS — E559 Vitamin D deficiency, unspecified: Secondary | ICD-10-CM | POA: Diagnosis not present

## 2023-02-09 DIAGNOSIS — E7800 Pure hypercholesterolemia, unspecified: Secondary | ICD-10-CM | POA: Diagnosis not present

## 2023-02-09 DIAGNOSIS — Z1331 Encounter for screening for depression: Secondary | ICD-10-CM | POA: Diagnosis not present

## 2023-02-09 DIAGNOSIS — Z79899 Other long term (current) drug therapy: Secondary | ICD-10-CM | POA: Diagnosis not present

## 2023-02-19 DIAGNOSIS — E2839 Other primary ovarian failure: Secondary | ICD-10-CM | POA: Diagnosis not present

## 2023-02-19 DIAGNOSIS — Z13820 Encounter for screening for osteoporosis: Secondary | ICD-10-CM | POA: Diagnosis not present

## 2023-02-19 DIAGNOSIS — Z79899 Other long term (current) drug therapy: Secondary | ICD-10-CM | POA: Diagnosis not present

## 2023-03-10 DIAGNOSIS — I10 Essential (primary) hypertension: Secondary | ICD-10-CM | POA: Diagnosis not present

## 2023-03-10 DIAGNOSIS — Z6841 Body Mass Index (BMI) 40.0 and over, adult: Secondary | ICD-10-CM | POA: Diagnosis not present

## 2023-03-10 DIAGNOSIS — E1165 Type 2 diabetes mellitus with hyperglycemia: Secondary | ICD-10-CM | POA: Diagnosis not present

## 2023-03-10 DIAGNOSIS — Z299 Encounter for prophylactic measures, unspecified: Secondary | ICD-10-CM | POA: Diagnosis not present

## 2023-03-23 DIAGNOSIS — E1129 Type 2 diabetes mellitus with other diabetic kidney complication: Secondary | ICD-10-CM | POA: Diagnosis not present

## 2023-03-23 DIAGNOSIS — I129 Hypertensive chronic kidney disease with stage 1 through stage 4 chronic kidney disease, or unspecified chronic kidney disease: Secondary | ICD-10-CM | POA: Diagnosis not present

## 2023-03-23 DIAGNOSIS — E1122 Type 2 diabetes mellitus with diabetic chronic kidney disease: Secondary | ICD-10-CM | POA: Diagnosis not present

## 2023-03-23 DIAGNOSIS — R809 Proteinuria, unspecified: Secondary | ICD-10-CM | POA: Diagnosis not present

## 2023-03-23 DIAGNOSIS — N179 Acute kidney failure, unspecified: Secondary | ICD-10-CM | POA: Diagnosis not present

## 2023-03-23 DIAGNOSIS — N189 Chronic kidney disease, unspecified: Secondary | ICD-10-CM | POA: Diagnosis not present

## 2023-03-23 DIAGNOSIS — N184 Chronic kidney disease, stage 4 (severe): Secondary | ICD-10-CM | POA: Diagnosis not present

## 2023-04-01 DIAGNOSIS — N1832 Chronic kidney disease, stage 3b: Secondary | ICD-10-CM | POA: Diagnosis not present

## 2023-04-01 DIAGNOSIS — E1129 Type 2 diabetes mellitus with other diabetic kidney complication: Secondary | ICD-10-CM | POA: Diagnosis not present

## 2023-04-01 DIAGNOSIS — I129 Hypertensive chronic kidney disease with stage 1 through stage 4 chronic kidney disease, or unspecified chronic kidney disease: Secondary | ICD-10-CM | POA: Diagnosis not present

## 2023-04-01 DIAGNOSIS — E1122 Type 2 diabetes mellitus with diabetic chronic kidney disease: Secondary | ICD-10-CM | POA: Diagnosis not present

## 2023-04-20 DIAGNOSIS — Z23 Encounter for immunization: Secondary | ICD-10-CM | POA: Diagnosis not present

## 2023-06-24 DIAGNOSIS — Z299 Encounter for prophylactic measures, unspecified: Secondary | ICD-10-CM | POA: Diagnosis not present

## 2023-06-24 DIAGNOSIS — Z6841 Body Mass Index (BMI) 40.0 and over, adult: Secondary | ICD-10-CM | POA: Diagnosis not present

## 2023-06-24 DIAGNOSIS — I10 Essential (primary) hypertension: Secondary | ICD-10-CM | POA: Diagnosis not present

## 2023-06-24 DIAGNOSIS — I152 Hypertension secondary to endocrine disorders: Secondary | ICD-10-CM | POA: Diagnosis not present

## 2023-06-24 DIAGNOSIS — E1159 Type 2 diabetes mellitus with other circulatory complications: Secondary | ICD-10-CM | POA: Diagnosis not present

## 2023-07-29 DIAGNOSIS — N1832 Chronic kidney disease, stage 3b: Secondary | ICD-10-CM | POA: Diagnosis not present

## 2023-07-29 DIAGNOSIS — R809 Proteinuria, unspecified: Secondary | ICD-10-CM | POA: Diagnosis not present

## 2023-07-29 DIAGNOSIS — E1122 Type 2 diabetes mellitus with diabetic chronic kidney disease: Secondary | ICD-10-CM | POA: Diagnosis not present

## 2023-07-29 DIAGNOSIS — N189 Chronic kidney disease, unspecified: Secondary | ICD-10-CM | POA: Diagnosis not present

## 2023-07-29 DIAGNOSIS — I129 Hypertensive chronic kidney disease with stage 1 through stage 4 chronic kidney disease, or unspecified chronic kidney disease: Secondary | ICD-10-CM | POA: Diagnosis not present

## 2023-07-29 DIAGNOSIS — E1129 Type 2 diabetes mellitus with other diabetic kidney complication: Secondary | ICD-10-CM | POA: Diagnosis not present

## 2023-08-05 DIAGNOSIS — I129 Hypertensive chronic kidney disease with stage 1 through stage 4 chronic kidney disease, or unspecified chronic kidney disease: Secondary | ICD-10-CM | POA: Diagnosis not present

## 2023-08-05 DIAGNOSIS — R809 Proteinuria, unspecified: Secondary | ICD-10-CM | POA: Diagnosis not present

## 2023-08-05 DIAGNOSIS — E1122 Type 2 diabetes mellitus with diabetic chronic kidney disease: Secondary | ICD-10-CM | POA: Diagnosis not present

## 2023-08-05 DIAGNOSIS — E1129 Type 2 diabetes mellitus with other diabetic kidney complication: Secondary | ICD-10-CM | POA: Diagnosis not present

## 2023-08-30 DIAGNOSIS — Z1211 Encounter for screening for malignant neoplasm of colon: Secondary | ICD-10-CM | POA: Diagnosis not present

## 2023-08-30 DIAGNOSIS — Z1212 Encounter for screening for malignant neoplasm of rectum: Secondary | ICD-10-CM | POA: Diagnosis not present

## 2023-09-27 NOTE — Progress Notes (Unsigned)
 Referring Provider: Ignatius Specking, MD  Primary Care Physician:  Wendall Papa Primary Gastroenterologist:  Dr. Jena Gauss  Chief Complaint  Patient presents with   Positive cologuard     Positive cologuard     HPI:   Nicole Sampson is a 73 y.o. female presenting today at the request of Vyas, Dhruv B, MD for positive Cologuard.  We received Cologuard report.  Test completed 08/30/2023 with positive result.  Today: Doing well at this time. Denies any concerns for me today. No prior colonoscopy.   Reports she does have a bowel movement daily to every other day.  No regular constipation or diarrhea.  She does usually take Metamucil which means her bowels moving.  No BRBPR, melena, abdominal pain, nausea, vomiting, dysphagia.  No unintentional weight loss.  Reports she did have a scant amount of rectal bleeding last week when she passed a hard stool, but this is not something that usually occurs as she does not typically have constipation.  She had some rectal pain/tearing when this happened as well.  No further symptoms.  Reports diabetes is well-controlled.   Past Medical History:  Diagnosis Date   Anemia    Arthritis    knees   CKD (chronic kidney disease)    Full dentures    GERD (gastroesophageal reflux disease)    Hypertension    followed by pcp  (05-10-2019  per pt never had a stress test)   PMB (postmenopausal bleeding)    Type 2 diabetes mellitus (HCC)    followed by pcp  (05-10-2019 check's cbg twice weekly,  fasting cbg-- 103)   Wears glasses     Past Surgical History:  Procedure Laterality Date   BREAST BIOPSY Right 10/22/2022   [place] RT BREAST BX W LOC DEV 1ST LESION IMG BX SPEC US GUIDE 10/22/2022 [facility]   BREAST MASS EXCISION Left 04/1996   HYSTEROSCOPY WITH D & C N/A 05/11/2019   Procedure: DILATATION AND CURETTAGE /HYSTEROSCOPY;  Surgeon: Waynard Reeds, MD;  Location: [facility] [place];  Service: Gynecology;  Laterality: N/A;   ORIF ANKLE  FRACTURE Right 07/06/2019   Procedure: OPEN REDUCTION INTERNAL FIXATION (ORIF) ANKLE FRACTURE;  Surgeon: Vickki Hearing, MD;  Location: [facility];  Service: Orthopedics;  Laterality: Right;   ROBOTIC ASSISTED TOTAL HYSTERECTOMY WITH BILATERAL SALPINGO OOPHERECTOMY Bilateral 06/15/2019   Procedure: XI ROBOTIC ASSISTED TOTAL HYSTERECTOMY GREATER THAN 250 GRAMS, WITH BILATERAL SALPINGO OOPHORECTOMY;  Surgeon: Adolphus Birchwood, MD;  Location: [facility];  Service: Gynecology;  Laterality: Bilateral;   SENTINEL NODE BIOPSY N/A 06/15/2019   Procedure: SENTINEL NODE BIOPSY;  Surgeon: Adolphus Birchwood, MD;  Location: [facility];  Service: Gynecology;  Laterality: N/A;    Current Outpatient Medications  Medication Sig Dispense Refill   ascorbic acid (VITAMIN C) 500 MG tablet Take 500 mg by mouth daily.     aspirin EC 81 MG tablet Take 81 mg by mouth every other day.      Calcium Citrate-Vitamin D (CALCIUM + D PO) Take 1 tablet by mouth daily.      FARXIGA 5 MG TABS tablet Take 5 mg by mouth every morning.     losartan (COZAAR) 50 MG tablet Take 50 mg by mouth daily.     Multiple Vitamin (MULTIVITAMIN) tablet Take 1 tablet by mouth daily.     rosuvastatin (CRESTOR) 5 MG tablet Take 5 mg by mouth once a week.     ferrous sulfate 325 (65 FE) MG EC tablet Take  by mouth. (Patient not taking: Reported on 09/29/2023)     hydrochlorothiazide (HYDRODIURIL) 12.5 MG tablet Take 12.5 mg by mouth daily. (Patient not taking: Reported on 12/14/2022)     No current facility-administered medications for this visit.    Allergies as of 09/29/2023 - Review Complete 09/29/2023  Allergen Reaction Noted   Other Swelling and Other (See Comments) 05/10/2019    Family History  Problem Relation Age of Onset   Uterine cancer Mother    Diabetes Mother    Hypertension Mother    Diabetes Father    Liver cancer Father    Hypertension Brother    Colon cancer Neg Hx    Breast cancer Neg Hx    Ovarian cancer Neg Hx    Endometrial cancer Neg  Hx    Pancreatic cancer Neg Hx    Prostate cancer Neg Hx     Social History   Socioeconomic History   Marital status: Single    Spouse name: Not on file   Number of children: Not on file   Years of education: Not on file   Highest education level: Not on file  Occupational History   Not on file  Tobacco Use   Smoking status: Never   Smokeless tobacco: Never  Vaping Use   Vaping status: Never Used  Substance and Sexual Activity   Alcohol use: Never   Drug use: Never   Sexual activity: Not Currently  Other Topics Concern   Not on file  Social History Narrative   Not on file   Social Drivers of Health   Financial Resource Strain: Not on file  Food Insecurity: Not on file  Transportation Needs: Not on file  Physical Activity: Not on file  Stress: Not on file  Social Connections: Not on file  Intimate Partner Violence: Not on file    Review of Systems: Gen: Denies any fever, chills, cold or flu like symptoms, pre-syncope, or syncope.  CV: Denies chest pain, heart palpitations.  Resp: Denies shortness of breath, cough.  GI: See HPI GU : Denies urinary burning, urinary frequency, urinary hesitancy MS: Denies joint pain.   Derm: Denies rash.  Psych: Denies depression, anxiety. Heme: See HPI  Physical Exam: BP 135/82 (BP Location: Left Arm, Patient Position: Sitting, Cuff Size: Large)   Pulse 96   Temp 97.6 F (36.4 C) (Temporal)   Ht 5\' 7"  (1.702 m)   Wt 268 lb 3.2 oz (121.7 kg)   BMI 42.01 kg/m  General:  Alert and oriented. Pleasant and cooperative. Well-nourished and well-developed.  Head:  Normocephalic and atraumatic. Eyes:  Without icterus, sclera clear and conjunctiva pink.  Ears:  Normal auditory acuity. Lungs:  Clear to auscultation bilaterally. No wheezes, rales, or rhonchi. No distress.  Heart:  S1, S2 present without murmurs appreciated.  Abdomen:  +BS, soft, non-tender and non-distended. No HSM noted. No guarding or rebound. No masses  appreciated.  Rectal:  Deferred  Msk:  Symmetrical without gross deformities. Normal posture. Extremities:  Without edema. Neurologic:  Alert and  oriented x4;  grossly normal neurologically. Skin:  Intact without significant lesions or rashes. Psych: Normal mood and affect.    Assessment:  73 year old female with history of arthritis, HTN, type 2 diabetes, CKD, anemia, presenting today at the request of Dr. Sherril Croon for further evaluation of positive Cologuard which was completed on 08/30/2023.  Patient has never had a colonoscopy.  She has no significant GI symptoms.  No alarm symptoms.  She did have a  scant amount of rectal bleeding last week when passing a hard stool where she felt slight tearingl, but this is not anything that occurs routinely and has had no recurrent symptoms.    Plan:  Proceed with colonoscopy with propofol by Dr. Jena Gauss in near future. The risks, benefits, and alternatives have been discussed with the patient in detail. The patient states understanding and desires to proceed.  ASA 3 Hold Farxiga x 3 days prior. Hold iron x 7 days prior. Follow-up per Dr. Luvenia Starch recommendations.   Ermalinda Memos, PA-C [facility] 09/29/2023

## 2023-09-29 ENCOUNTER — Encounter: Payer: Self-pay | Admitting: Gastroenterology

## 2023-09-29 ENCOUNTER — Ambulatory Visit (INDEPENDENT_AMBULATORY_CARE_PROVIDER_SITE_OTHER): Admitting: Gastroenterology

## 2023-09-29 VITALS — BP 135/82 | HR 96 | Temp 97.6°F | Ht 67.0 in | Wt 268.2 lb

## 2023-09-29 DIAGNOSIS — R195 Other fecal abnormalities: Secondary | ICD-10-CM | POA: Diagnosis not present

## 2023-09-29 NOTE — Patient Instructions (Addendum)
 We will get you scheduled for a colonoscopy in the near future with Dr. Jena Gauss.   We will see you back in the office as Dr. Jena Gauss recommends.    Ermalinda Memos, PA-C [facility]

## 2023-10-06 DIAGNOSIS — E1165 Type 2 diabetes mellitus with hyperglycemia: Secondary | ICD-10-CM | POA: Diagnosis not present

## 2023-10-06 DIAGNOSIS — D692 Other nonthrombocytopenic purpura: Secondary | ICD-10-CM | POA: Diagnosis not present

## 2023-10-06 DIAGNOSIS — Z299 Encounter for prophylactic measures, unspecified: Secondary | ICD-10-CM | POA: Diagnosis not present

## 2023-10-06 DIAGNOSIS — E7800 Pure hypercholesterolemia, unspecified: Secondary | ICD-10-CM | POA: Diagnosis not present

## 2023-10-06 DIAGNOSIS — N1832 Chronic kidney disease, stage 3b: Secondary | ICD-10-CM | POA: Diagnosis not present

## 2023-10-06 DIAGNOSIS — I10 Essential (primary) hypertension: Secondary | ICD-10-CM | POA: Diagnosis not present

## 2023-10-19 ENCOUNTER — Telehealth: Payer: Self-pay | Admitting: *Deleted

## 2023-10-19 NOTE — Telephone Encounter (Signed)
 Called pt, no answer and no VM set up. Need to schedule TCS with Dr. Jena Gauss, ASA 3, no iron x 7d ays prior, no farxiga x 3 days prior

## 2023-10-27 MED ORDER — PEG 3350-KCL-NA BICARB-NACL 420 G PO SOLR: 4000.0000 mL | Freq: Once | ORAL | 0 refills | Status: AC

## 2023-10-27 NOTE — Telephone Encounter (Signed)
 Spoke with pt. She has been scheduled for 5/16. Aware will send instructions in the mail. Rx for prep sent to [facility]. Aware of medications to hold and when. Will call back with pre-op appointment.

## 2023-10-27 NOTE — Telephone Encounter (Signed)
 Spoke with pt. Aware of pre-op appt details.

## 2023-11-03 DIAGNOSIS — Z01419 Encounter for gynecological examination (general) (routine) without abnormal findings: Secondary | ICD-10-CM | POA: Diagnosis not present

## 2023-11-03 DIAGNOSIS — Z1231 Encounter for screening mammogram for malignant neoplasm of breast: Secondary | ICD-10-CM | POA: Diagnosis not present

## 2023-11-18 DIAGNOSIS — N189 Chronic kidney disease, unspecified: Secondary | ICD-10-CM | POA: Diagnosis not present

## 2023-11-18 DIAGNOSIS — D631 Anemia in chronic kidney disease: Secondary | ICD-10-CM | POA: Diagnosis not present

## 2023-11-18 DIAGNOSIS — R809 Proteinuria, unspecified: Secondary | ICD-10-CM | POA: Diagnosis not present

## 2023-11-22 NOTE — Patient Instructions (Signed)
   Your procedure is scheduled on: 11/26/2023  Report to [facility] Main Entrance at  7:15   AM.  Call this number if you have problems the morning of surgery: [phone]   Remember:              Follow Directions on the letter you received from Your Physician's office regarding the Bowel Prep              No Smoking the day of Procedure :   Take these medicines the morning of surgery with A SIP OF WATER : none  Hold Farxiga for 3 days prior to procedure last dose 11/22/23 Hold iron for 7 days prior to procedure   Do not wear jewelry, make-up or nail polish.    Do not bring valuables to the hospital.  Contacts, dentures or bridgework may not be worn into surgery.  .   Patients discharged the day of surgery will not be allowed to drive home.     Colonoscopy, Adult, Care After This sheet gives you information about how to care for yourself after your procedure. Your health care provider may also give you more specific instructions. If you have problems or questions, contact your health care provider. What can I expect after the procedure? After the procedure, it is common to have: A small amount of blood in your stool for 24 hours after the procedure. Some gas. Mild abdominal cramping or bloating.  Follow these instructions at home: General instructions  For the first 24 hours after the procedure: Do not drive or use machinery. Do not sign important documents. Do not drink alcohol . Do your regular daily activities at a slower pace than normal. Eat soft, easy-to-digest foods. Rest often. Take over-the-counter or prescription medicines only as told by your health care provider. It is up to you to get the results of your procedure. Ask your health care provider, or the department performing the procedure, when your results will be ready. Relieving cramping and bloating Try walking around when you have cramps or feel bloated. Apply heat to your abdomen as told by your health  care provider. Use a heat source that your health care provider recommends, such as a moist heat pack or a heating pad. Place a towel between your skin and the heat source. Leave the heat on for 20-30 minutes. Remove the heat if your skin turns bright red. This is especially important if you are unable to feel pain, heat, or cold. You may have a greater risk of getting burned. Eating and drinking Drink enough fluid to keep your urine clear or pale yellow. Resume your normal diet as instructed by your health care provider. Avoid heavy or fried foods that are hard to digest. Avoid drinking alcohol  for as long as instructed by your health care provider. Contact a health care provider if: You have blood in your stool 2-3 days after the procedure. Get help right away if: You have more than a small spotting of blood in your stool. You pass large blood clots in your stool. Your abdomen is swollen. You have nausea or vomiting. You have a fever. You have increasing abdominal pain that is not relieved with medicine. This information is not intended to replace advice given to you by your health care provider. Make sure you discuss any questions you have with your health care provider. Document Released: 02/11/2004 Document Revised: 03/23/2016 Document Reviewed: 09/10/2015 Elsevier Interactive Patient Education  [employer].

## 2023-11-23 ENCOUNTER — Encounter (HOSPITAL_COMMUNITY)
Admission: RE | Admit: 2023-11-23 | Discharge: 2023-11-23 | Disposition: A | Discharge status: Home / Self Care | Source: Ambulatory Visit | Attending: Internal Medicine | Admitting: Internal Medicine

## 2023-11-23 DIAGNOSIS — E119 Type 2 diabetes mellitus without complications: Secondary | ICD-10-CM

## 2023-11-23 DIAGNOSIS — I10 Essential (primary) hypertension: Secondary | ICD-10-CM

## 2023-11-23 DIAGNOSIS — E66813 Obesity, class 3: Secondary | ICD-10-CM

## 2023-11-24 ENCOUNTER — Encounter (HOSPITAL_COMMUNITY)
Admission: RE | Admit: 2023-11-24 | Discharge: 2023-11-24 | Disposition: A | Discharge status: Home / Self Care | Source: Ambulatory Visit | Attending: Internal Medicine | Admitting: Internal Medicine

## 2023-11-24 ENCOUNTER — Encounter (HOSPITAL_COMMUNITY): Payer: Self-pay

## 2023-11-24 DIAGNOSIS — Z6841 Body Mass Index (BMI) 40.0 and over, adult: Secondary | ICD-10-CM | POA: Diagnosis not present

## 2023-11-24 DIAGNOSIS — I10 Essential (primary) hypertension: Secondary | ICD-10-CM | POA: Insufficient documentation

## 2023-11-24 DIAGNOSIS — E119 Type 2 diabetes mellitus without complications: Secondary | ICD-10-CM | POA: Insufficient documentation

## 2023-11-24 DIAGNOSIS — E66813 Obesity, class 3: Secondary | ICD-10-CM | POA: Diagnosis not present

## 2023-11-24 DIAGNOSIS — Z01818 Encounter for other preprocedural examination: Secondary | ICD-10-CM | POA: Insufficient documentation

## 2023-11-24 LAB — BASIC METABOLIC PANEL WITH GFR
Anion gap: 7 (ref 5–15)
BUN: 31 mg/dL — ABNORMAL HIGH (ref 8–23)
CO2: 22 mmol/L (ref 22–32)
Calcium: 9 mg/dL (ref 8.9–10.3)
Chloride: 108 mmol/L (ref 98–111)
Creatinine, Ser: 1.78 mg/dL — ABNORMAL HIGH (ref 0.44–1.00)
GFR, Estimated: 30 mL/min — ABNORMAL LOW (ref >60)
Glucose, Bld: 119 mg/dL — ABNORMAL HIGH (ref 70–99)
Potassium: 4.4 mmol/L (ref 3.5–5.1)
Sodium: 137 mmol/L (ref 135–145)

## 2023-11-25 DIAGNOSIS — E1129 Type 2 diabetes mellitus with other diabetic kidney complication: Secondary | ICD-10-CM | POA: Diagnosis not present

## 2023-11-25 DIAGNOSIS — I129 Hypertensive chronic kidney disease with stage 1 through stage 4 chronic kidney disease, or unspecified chronic kidney disease: Secondary | ICD-10-CM | POA: Diagnosis not present

## 2023-11-25 DIAGNOSIS — E1122 Type 2 diabetes mellitus with diabetic chronic kidney disease: Secondary | ICD-10-CM | POA: Diagnosis not present

## 2023-11-25 DIAGNOSIS — N1832 Chronic kidney disease, stage 3b: Secondary | ICD-10-CM | POA: Diagnosis not present

## 2023-11-26 ENCOUNTER — Ambulatory Visit (HOSPITAL_COMMUNITY): Admitting: Anesthesiology

## 2023-11-26 ENCOUNTER — Encounter (HOSPITAL_COMMUNITY)
Admission: RE | Disposition: A | Discharge status: Home / Self Care | Payer: Self-pay | Source: Home / Self Care | Attending: Internal Medicine

## 2023-11-26 ENCOUNTER — Encounter (HOSPITAL_COMMUNITY): Payer: Self-pay | Admitting: Internal Medicine

## 2023-11-26 ENCOUNTER — Ambulatory Visit (HOSPITAL_COMMUNITY)
Admission: RE | Admit: 2023-11-26 | Discharge: 2023-11-26 | Disposition: A | Discharge status: Home / Self Care | Attending: Internal Medicine | Admitting: Internal Medicine

## 2023-11-26 ENCOUNTER — Other Ambulatory Visit: Payer: Self-pay

## 2023-11-26 DIAGNOSIS — Z1211 Encounter for screening for malignant neoplasm of colon: Secondary | ICD-10-CM | POA: Diagnosis not present

## 2023-11-26 DIAGNOSIS — E6689 Other obesity not elsewhere classified: Secondary | ICD-10-CM | POA: Diagnosis not present

## 2023-11-26 DIAGNOSIS — I129 Hypertensive chronic kidney disease with stage 1 through stage 4 chronic kidney disease, or unspecified chronic kidney disease: Secondary | ICD-10-CM | POA: Insufficient documentation

## 2023-11-26 DIAGNOSIS — D631 Anemia in chronic kidney disease: Secondary | ICD-10-CM | POA: Diagnosis not present

## 2023-11-26 DIAGNOSIS — K635 Polyp of colon: Secondary | ICD-10-CM

## 2023-11-26 DIAGNOSIS — D124 Benign neoplasm of descending colon: Secondary | ICD-10-CM

## 2023-11-26 DIAGNOSIS — Z7984 Long term (current) use of oral hypoglycemic drugs: Secondary | ICD-10-CM | POA: Diagnosis not present

## 2023-11-26 DIAGNOSIS — R195 Other fecal abnormalities: Secondary | ICD-10-CM | POA: Insufficient documentation

## 2023-11-26 DIAGNOSIS — D123 Benign neoplasm of transverse colon: Secondary | ICD-10-CM | POA: Insufficient documentation

## 2023-11-26 DIAGNOSIS — Z833 Family history of diabetes mellitus: Secondary | ICD-10-CM | POA: Diagnosis not present

## 2023-11-26 DIAGNOSIS — Z7982 Long term (current) use of aspirin: Secondary | ICD-10-CM | POA: Diagnosis not present

## 2023-11-26 DIAGNOSIS — D3610 Benign neoplasm of peripheral nerves and autonomic nervous system, unspecified: Secondary | ICD-10-CM | POA: Diagnosis not present

## 2023-11-26 DIAGNOSIS — Z79899 Other long term (current) drug therapy: Secondary | ICD-10-CM | POA: Diagnosis not present

## 2023-11-26 DIAGNOSIS — I10 Essential (primary) hypertension: Secondary | ICD-10-CM

## 2023-11-26 DIAGNOSIS — Z6841 Body Mass Index (BMI) 40.0 and over, adult: Secondary | ICD-10-CM | POA: Diagnosis not present

## 2023-11-26 DIAGNOSIS — K5730 Diverticulosis of large intestine without perforation or abscess without bleeding: Secondary | ICD-10-CM | POA: Insufficient documentation

## 2023-11-26 DIAGNOSIS — E119 Type 2 diabetes mellitus without complications: Secondary | ICD-10-CM | POA: Diagnosis not present

## 2023-11-26 DIAGNOSIS — E1122 Type 2 diabetes mellitus with diabetic chronic kidney disease: Secondary | ICD-10-CM | POA: Diagnosis not present

## 2023-11-26 DIAGNOSIS — N189 Chronic kidney disease, unspecified: Secondary | ICD-10-CM | POA: Diagnosis not present

## 2023-11-26 DIAGNOSIS — K219 Gastro-esophageal reflux disease without esophagitis: Secondary | ICD-10-CM | POA: Diagnosis not present

## 2023-11-26 HISTORY — PX: COLONOSCOPY: SHX5424

## 2023-11-26 LAB — GLUCOSE, CAPILLARY: Glucose-Capillary: 133 mg/dL — ABNORMAL HIGH (ref 70–99)

## 2023-11-26 SURGERY — COLONOSCOPY
Anesthesia: General

## 2023-11-26 MED ORDER — SPOT INK MARKER SYRINGE KIT
PACK | SUBMUCOSAL | Status: DC | PRN
  Administered 2023-11-26: 1 mL via SUBMUCOSAL

## 2023-11-26 MED ORDER — PROPOFOL 500 MG/50ML IV EMUL
INTRAVENOUS | Status: DC | PRN
  Administered 2023-11-26: 150 ug/kg/min via INTRAVENOUS

## 2023-11-26 MED ORDER — LACTATED RINGERS IV SOLN: INTRAVENOUS | Status: DC | PRN

## 2023-11-26 MED ORDER — PROPOFOL 10 MG/ML IV BOLUS
INTRAVENOUS | Status: DC | PRN
  Administered 2023-11-26 (×2): 50 mg via INTRAVENOUS

## 2023-11-26 NOTE — Discharge Instructions (Addendum)
  Colonoscopy Discharge Instructions  Read the instructions outlined below and refer to this sheet in the next few weeks. These discharge instructions provide you with general information on caring for yourself after you leave the hospital. Your doctor may also give you specific instructions. While your treatment has been planned according to the most current medical practices available, unavoidable complications occasionally occur. If you have any problems or questions after discharge, call Dr. Riley Cheadle at [phone]. ACTIVITY You may resume your regular activity, but move at a slower pace for the next 24 hours.  Take frequent rest periods for the next 24 hours.  Walking will help get rid of the air and reduce the bloated feeling in your belly (abdomen).  No driving for 24 hours (because of the medicine (anesthesia) used during the test).   Do not sign any important legal documents or operate any machinery for 24 hours (because of the anesthesia used during the test).  NUTRITION Drink plenty of fluids.  You may resume your normal diet as instructed by your doctor.  Begin with a light meal and progress to your normal diet. Heavy or fried foods are harder to digest and may make you feel sick to your stomach (nauseated).  Avoid alcoholic beverages for 24 hours or as instructed.  MEDICATIONS You may resume your normal medications unless your doctor tells you otherwise.  WHAT YOU CAN EXPECT TODAY Some feelings of bloating in the abdomen.  Passage of more gas than usual.  Spotting of blood in your stool or on the toilet paper.  IF YOU HAD POLYPS REMOVED DURING THE COLONOSCOPY: No aspirin  products for 7 days or as instructed.  No alcohol  for 7 days or as instructed.  Eat a soft diet for the next 24 hours.  FINDING OUT THE RESULTS OF YOUR TEST Not all test results are available during your visit. If your test results are not back during the visit, make an appointment with your caregiver to find out the  results. Do not assume everything is normal if you have not heard from your caregiver or the medical facility. It is important for you to follow up on all of your test results.  SEEK IMMEDIATE MEDICAL ATTENTION IF: You have more than a spotting of blood in your stool.  Your belly is swollen (abdominal distention).  You are nauseated or vomiting.  You have a temperature over 101.  You have abdominal pain or discomfort that is severe or gets worse throughout the day.     Diverticulosis throughout your colon  8 polyps removed from your colon   further recommendations to follow pending review of pathology report

## 2023-11-26 NOTE — Op Note (Signed)
 [facility] Patient Name: Nicole Sampson Procedure Date: 11/26/2023 9:29 AM MRN: [id number] Date of Birth: 1950-08-16 Attending MD: Gemma Kelp , MD, [id number] CSN: [id number] Age: 73 Admit Type: Outpatient Procedure:                Colonoscopy Indications:              Positive Cologuard test Providers:                Gemma Kelp, MD, Alisa App, Crystal                            Page, Jolee Naval, Technician Referring MD:              Medicines:                Propofol  per Anesthesia Complications:             Estimated Blood Loss:     Estimated blood loss was minimal. Procedure:                Pre-Anesthesia Assessment:                           - Prior to the procedure, a History and Physical                            was performed, and patient medications and                            allergies were reviewed. The patient's tolerance of                            previous anesthesia was also reviewed. The risks                            and benefits of the procedure and the sedation                            options and risks were discussed with the patient.                            All questions were answered, and informed consent                            was obtained. Prior Anticoagulants: The patient has                            taken no anticoagulant or antiplatelet agents. ASA                            Grade Assessment: III - A patient with severe                            systemic disease. After reviewing the risks and  benefits, the patient was deemed in satisfactory                            condition to undergo the procedure.                           After obtaining informed consent, the colonoscope                            was passed under direct vision. Throughout the                            procedure, the patient's blood pressure, pulse, and                            oxygen saturations were  monitored continuously. The                            [id number]) scope was introduced through the                            anus and advanced to the the cecum, identified by                            appendiceal orifice and ileocecal valve. The                            colonoscopy was performed without difficulty. The                            patient tolerated the procedure well. The quality                            of the bowel preparation was adequate. The                            ileocecal valve, appendiceal orifice, and rectum                            were photographed. Scope In: 10:01:39 AM Scope Out: 10:33:10 AM Scope Withdrawal Time: 0 hours 22 minutes 19 seconds  Total Procedure Duration: 0 hours 31 minutes 31 seconds  Findings:      The perianal and digital rectal examinations were normal.      Six semi-pedunculated polyps were found in the hepatic flexure. The       polyps were 4 to 6 mm in size. These polyps were removed with a cold       snare. Resection and retrieval were complete. Estimated blood loss was       minimal.      A 5 mm polyp was found in the proximal descending colon. The polyp was       sessile. The polyp was removed with a cold snare. Resection and       retrieval were complete. Estimated blood loss was minimal.      A 20 mm polyp was found  in the distal descending colon. The polyp was       pedunculated. Stalk clipped x 1. 1 cc of ink placed to demarcate site       the polyp was removed with a hot snare. Resection and retrieval were       complete. Estimated blood loss: none.      The exam was otherwise without abnormality on direct and retroflexion       views.      Many large-mouthed diverticula were found in the entire colon.      The exam was otherwise without abnormality on direct and retroflexion       views. Impression:               - Six 4 to 6 mm polyps at the hepatic flexure,                            removed with a cold  snare. Resected and retrieved.                           - One 5 mm polyp in the proximal descending colon,                            removed with a cold snare. Resected and retrieved.                           - One 20 mm polyp in the distal descending colon,                            removed with a hot snare. Resected and retrieved.                            Ink marked and clipped                           - The examination was otherwise normal on direct                            and retroflexion views.                           - Diverticulosis in the entire examined colon.                           - The examination was otherwise normal on direct                            and retroflexion views. Moderate Sedation:      Moderate (conscious) sedation was personally administered by an       anesthesia professional. The following parameters were monitored: oxygen       saturation, heart rate, blood pressure, respiratory rate, EKG, adequacy       of pulmonary ventilation, and response to care. Recommendation:           - Patient has a contact number available for  emergencies. The signs and symptoms of potential                            delayed complications were discussed with the                            patient. Return to normal activities tomorrow.                            Written discharge instructions were provided to the                            patient.                           - Advance diet as tolerated.                           - Continue present medications.                           - Repeat colonoscopy date to be determined after                            pending pathology results are reviewed for                            surveillance.                           - Return to GI office (date not yet determined). Procedure Code(s):        --- Professional ---                           [phone], Colonoscopy, flexible; with removal of                             tumor(s), polyp(s), or other lesion(s) by snare                            technique Diagnosis Code(s):        --- Professional ---                           D12.3, Benign neoplasm of transverse colon (hepatic                            flexure or splenic flexure)                           D12.4, Benign neoplasm of descending colon                           R19.5, Other fecal abnormalities                           K57.30, Diverticulosis of  large intestine without                            perforation or abscess without bleeding CPT copyright 2022 American Medical Association. All rights reserved. The codes documented in this report are preliminary and upon coder review may  be revised to meet current compliance requirements. Windsor Hatcher. Sahalie Beth, MD Gemma Kelp, MD 11/26/2023 10:39:54 AM This report has been signed electronically. Number of Addenda: 0

## 2023-11-26 NOTE — Anesthesia Preprocedure Evaluation (Signed)
 Anesthesia Evaluation  Patient identified by MRN, date of birth, ID band Patient awake    Reviewed: Allergy & Precautions, H&P , NPO status , Patient's Chart, lab work & pertinent test results, reviewed documented beta blocker date and time   Airway Mallampati: II  TM Distance: >3 FB Neck ROM: full    Dental no notable dental hx.    Pulmonary neg pulmonary ROS   Pulmonary exam normal breath sounds clear to auscultation       Cardiovascular Exercise Tolerance: Good hypertension,  Rhythm:regular Rate:Normal     Neuro/Psych negative neurological ROS  negative psych ROS   GI/Hepatic Neg liver ROS,GERD  ,,  Endo/Other  diabetes  Class 4 obesity  Renal/GU Renal disease  negative genitourinary   Musculoskeletal   Abdominal   Peds  Hematology  (+) Blood dyscrasia, anemia   Anesthesia Other Findings   Reproductive/Obstetrics negative OB ROS                             Anesthesia Physical Anesthesia Plan  ASA: 3  Anesthesia Plan: General   Post-op Pain Management:    Induction:   PONV Risk Score and Plan: Propofol  infusion  Airway Management Planned:   Additional Equipment:   Intra-op Plan:   Post-operative Plan:   Informed Consent: I have reviewed the patients History and Physical, chart, labs and discussed the procedure including the risks, benefits and alternatives for the proposed anesthesia with the patient or authorized representative who has indicated his/her understanding and acceptance.     Dental Advisory Given  Plan Discussed with: CRNA  Anesthesia Plan Comments:        Anesthesia Quick Evaluation

## 2023-11-26 NOTE — Anesthesia Postprocedure Evaluation (Signed)
 Anesthesia Post Note  Patient: Nicole Sampson  Procedure(s) Performed: COLONOSCOPY  Patient location during evaluation: Phase II Anesthesia Type: General Level of consciousness: awake Pain management: pain level controlled Vital Signs Assessment: post-procedure vital signs reviewed and stable Respiratory status: spontaneous breathing and respiratory function stable Cardiovascular status: blood pressure returned to baseline and stable Postop Assessment: no headache and no apparent nausea or vomiting Anesthetic complications: no Comments: Late entry   No notable events documented.   Last Vitals:  Vitals:   11/26/23 0817 11/26/23 1040  BP:  (!) 127/58  Pulse: 81 86  Resp: 18 16  Temp: 36.9 C 36.4 C  SpO2: 100% 100%    Last Pain:  Vitals:   11/26/23 1040  TempSrc: Oral  PainSc: 0-No pain                 Coretha Dew

## 2023-11-26 NOTE — Transfer of Care (Signed)
 Immediate Anesthesia Transfer of Care Note  Patient: Nicole Sampson  Procedure(s) Performed: COLONOSCOPY  Patient Location: Endoscopy Unit  Anesthesia Type:General  Level of Consciousness: awake, alert , oriented, and patient cooperative  Airway & Oxygen Therapy: Patient Spontanous Breathing  Post-op Assessment: Report given to RN, Post -op Vital signs reviewed and stable, and Patient moving all extremities X 4  Post vital signs: Reviewed and stable  Last Vitals:  Vitals Value Taken Time  BP 127/58 11/26/23 1040  Temp 36.4 C 11/26/23 1040  Pulse 86 11/26/23 1040  Resp 16 11/26/23 1040  SpO2 100 % 11/26/23 1040    Last Pain:  Vitals:   11/26/23 1040  TempSrc: Oral  PainSc: 0-No pain      Patients Stated Pain Goal: 7 (11/26/23 0817)  Complications: No notable events documented.

## 2023-11-26 NOTE — H&P (Signed)
 @LOGO @   Primary Care Physician:  Hairfield, Keavie C Primary Gastroenterologist:  Dr. Riley Cheadle  Pre-Procedure History & Physical: HPI:  Nicole Sampson is a 73 y.o. female here for diagnostic colonoscopy.  Positive Cologuard.  No bowel symptoms no prior colonoscopy.  Past Medical History:  Diagnosis Date   Anemia    Arthritis    knees   CKD (chronic kidney disease)    Full dentures    GERD (gastroesophageal reflux disease)    Hypertension    followed by pcp  (05-10-2019  per pt never had a stress test)   PMB (postmenopausal bleeding)    Type 2 diabetes mellitus (HCC)    followed by pcp  (05-10-2019 check's cbg twice weekly,  fasting cbg-- 103)   Wears glasses     Past Surgical History:  Procedure Laterality Date   BREAST BIOPSY Right 10/22/2022   US  RT BREAST BX W LOC DEV 1ST LESION IMG BX SPEC US  GUIDE 10/22/2022 [facility]   BREAST MASS EXCISION Left 04/1996   HYSTEROSCOPY WITH D & C N/A 05/11/2019   Procedure: DILATATION AND CURETTAGE /HYSTEROSCOPY;  Surgeon: Reggy Capers, MD;  Location: [facility];  Service: Gynecology;  Laterality: N/A;   ORIF ANKLE FRACTURE Right 07/06/2019   Procedure: OPEN REDUCTION INTERNAL FIXATION (ORIF) ANKLE FRACTURE;  Surgeon: Darrin Emerald, MD;  Location: [facility];  Service: Orthopedics;  Laterality: Right;   ROBOTIC ASSISTED TOTAL HYSTERECTOMY WITH BILATERAL SALPINGO OOPHERECTOMY Bilateral 06/15/2019   Procedure: XI ROBOTIC ASSISTED TOTAL HYSTERECTOMY GREATER THAN 250 GRAMS, WITH BILATERAL SALPINGO OOPHORECTOMY;  Surgeon: Alphonso Aschoff, MD;  Location: [facility];  Service: Gynecology;  Laterality: Bilateral;   SENTINEL NODE BIOPSY N/A 06/15/2019   Procedure: SENTINEL NODE BIOPSY;  Surgeon: Alphonso Aschoff, MD;  Location: [facility];  Service: Gynecology;  Laterality: N/A;    Prior to Admission medications   Medication Sig Start Date End Date Taking? Authorizing Provider  ascorbic acid (VITAMIN C) 500 MG tablet Take 500 mg by mouth  daily.   Yes [provider]  aspirin  EC 81 MG tablet Take 81 mg by mouth every other day.    Yes [provider]  Calcium  Citrate-Vitamin D (CALCIUM  + D PO) Take 1 tablet by mouth daily.    Yes [provider]  losartan  (COZAAR ) 50 MG tablet Take 50 mg by mouth daily.   Yes [provider]  Multiple Vitamin (MULTIVITAMIN) tablet Take 1 tablet by mouth daily.   Yes [provider]  rosuvastatin  (CRESTOR ) 5 MG tablet Take 5 mg by mouth once a week. 12/08/19  Yes [provider]  FARXIGA 5 MG TABS tablet Take 5 mg by mouth every morning.    [provider]  ferrous sulfate 325 (65 FE) MG EC tablet Take by mouth. Patient not taking: Reported on 09/29/2023 10/15/22 10/15/23  [provider]  hydrochlorothiazide  (HYDRODIURIL ) 12.5 MG tablet Take 12.5 mg by mouth daily. Patient not taking: Reported on 12/14/2022 02/23/20   [provider]    Allergies as of 10/27/2023 - Review Complete 09/29/2023  Allergen Reaction Noted   Other Swelling and Other (See Comments) 05/10/2019    Family History  Problem Relation Age of Onset   Uterine cancer Mother    Diabetes Mother    Hypertension Mother    Diabetes Father    Liver cancer Father    Hypertension Brother    Colon cancer Neg Hx    Breast cancer Neg Hx    Ovarian  cancer Neg Hx    Endometrial cancer Neg Hx    Pancreatic cancer Neg Hx    Prostate cancer Neg Hx     Social History   Socioeconomic History   Marital status: Single    Spouse name: Not on file   Number of children: Not on file   Years of education: Not on file   Highest education level: Not on file  Occupational History   Not on file  Tobacco Use   Smoking status: Never   Smokeless tobacco: Never  Vaping Use   Vaping status: Never Used  Substance and Sexual Activity   Alcohol  use: Never   Drug use: Never   Sexual activity: Not Currently  Other Topics Concern   Not on file  Social History  Narrative   Not on file   Social Drivers of Health   Financial Resource Strain: Not on file  Food Insecurity: Not on file  Transportation Needs: Not on file  Physical Activity: Not on file  Stress: Not on file  Social Connections: Not on file  Intimate Partner Violence: Not on file    Review of Systems: See HPI, otherwise negative ROS  Physical Exam: Pulse 81   Temp 98.4 F (36.9 C) (Oral)   Resp 18   Ht 5\' 7"  (1.702 m)   Wt 121 kg   SpO2 100%   BMI 41.78 kg/m  General:   Alert,  Well-developed, well-nourished, pleasant and cooperative in NAD Mouth:  No deformity or lesions. Neck:  Supple; no masses or thyromegaly. No significant cervical adenopathy. Lungs:  Clear throughout to auscultation.   No wheezes, crackles, or rhonchi. No acute distress. Heart:  Regular rate and rhythm; no murmurs, clicks, rubs,  or gallops. Abdomen: Non-distended, normal bowel sounds.  Soft and nontender without appreciable mass or hepatosplenomegaly.   Impression/Plan: 73 year old lady here for further evaluation of a positive Cologuard via colonoscopy.  No prior examination. The risks, benefits, limitations, alternatives and imponderables have been reviewed with the patient. Questions have been answered. All parties are agreeable.       Notice: This dictation was prepared with Dragon dictation along with smaller phrase technology. Any transcriptional errors that result from this process are unintentional and may not be corrected upon review.

## 2023-11-29 LAB — SURGICAL PATHOLOGY

## 2023-11-30 ENCOUNTER — Encounter (HOSPITAL_COMMUNITY): Payer: Self-pay | Admitting: Internal Medicine

## 2023-12-03 ENCOUNTER — Ambulatory Visit: Payer: Self-pay | Admitting: Internal Medicine

## 2023-12-22 ENCOUNTER — Inpatient Hospital Stay: Attending: Obstetrics & Gynecology | Admitting: Obstetrics & Gynecology

## 2023-12-22 VITALS — BP 137/74 | HR 84 | Temp 97.8°F | Resp 17 | Ht 67.0 in | Wt 263.2 lb

## 2023-12-22 DIAGNOSIS — C541 Malignant neoplasm of endometrium: Secondary | ICD-10-CM

## 2023-12-22 DIAGNOSIS — Z8542 Personal history of malignant neoplasm of other parts of uterus: Secondary | ICD-10-CM | POA: Insufficient documentation

## 2023-12-22 NOTE — Progress Notes (Signed)
 Follow Up Note: Gyn-Onc  Nicole Sampson 73 y.o. female  CC: She presents for a f/u visit   HPI: The oncology history was reviewed.  Interval History: She denies any vaginal bleeding, abdominal/pelvic pain, cough, lethargy or increasing abdominal girth.     Review of Systems  Review of Systems  Constitutional:  Negative for malaise/fatigue and weight loss.  Respiratory:  Negative for shortness of breath and wheezing.   Cardiovascular:  Negative for chest pain and leg swelling.  Gastrointestinal:  Negative for abdominal pain, blood in stool, constipation, nausea and vomiting.  Genitourinary:  Negative for dysuria, frequency, hematuria and urgency.  Musculoskeletal:  Negative for joint pain and myalgias.  Neurological:  Negative for weakness.  Psychiatric/Behavioral:  Negative for depression. The patient does not have insomnia.    Current medications, allergy, social history, past surgical history, past medical history, family history were all reviewed.    Vitals:  BP 137/74 (BP Location: Left Arm, Patient Position: Sitting)   Pulse 84   Temp 97.8 F (36.6 C) (Oral)   Resp 17   Ht 5' 7 (1.702 m)   Wt 263 lb 3.2 oz (119.4 kg)   SpO2 99%   BMI 41.22 kg/m    Physical Exam:  Physical Exam Exam conducted with a chaperone present.  Constitutional:      General: She is not in acute distress. Cardiovascular:     Rate and Rhythm: Normal rate and regular rhythm.  Pulmonary:     Effort: Pulmonary effort is normal.     Breath sounds: Normal breath sounds. No wheezing or rhonchi.  Abdominal:     Palpations: Abdomen is soft.     Tenderness: There is no abdominal tenderness. There is no right CVA tenderness or left CVA tenderness.     Hernia: No hernia is present.  Genitourinary:    General: Normal vulva.     Urethra: No urethral lesion.     Vagina: No lesions. No bleeding.  Synechiae palpated at the apex Musculoskeletal:     Cervical back: Neck supple.     Right lower leg:  No edema.     Left lower leg: No edema.  Lymphadenopathy:     Upper Body:     Right upper body: No supraclavicular adenopathy.     Left upper body: No supraclavicular adenopathy.     Lower Body: No right inguinal adenopathy. No left inguinal adenopathy.  Skin:    Findings: No rash.  Neurological:     Mental Status: She is oriented to person, place, and time.   Assessment/Plan:    Endometrial cancer [facility]) [name]  is a  73 y.o.  A history of stage IA grade 1 endometrial adenocarcinoma s/p robotic staging on 06/15/19. MSI High with loss of MLH1. Declined genetics evaluation.   Negative symptom review, normal exam.  No evidence of recurrence   -She continue annual visits w/Dr Avanell Bob     I personally spent 25 minutes face-to-face and non-face-to-face in the care of this patient, which includes all pre, intra, and post visit time on the date of service.    Abdul Hodgkin, MD

## 2023-12-22 NOTE — Patient Instructions (Signed)
 Return as needed

## 2023-12-22 NOTE — Assessment & Plan Note (Addendum)
 [name]  is a  72 y.o.  A history of stage IA grade 1 endometrial adenocarcinoma s/p robotic staging on 06/15/19. MSI High with loss of MLH1. Declined genetics evaluation.   Negative symptom review, normal exam.  No evidence of recurrence   -She continue annual visits w/Dr Avanell Bob

## 2023-12-22 NOTE — Progress Notes (Deleted)
 This note was entered in error.

## 2023-12-23 ENCOUNTER — Encounter: Payer: Self-pay | Admitting: Obstetrics & Gynecology

## 2024-01-10 DIAGNOSIS — E1165 Type 2 diabetes mellitus with hyperglycemia: Secondary | ICD-10-CM | POA: Diagnosis not present

## 2024-01-10 DIAGNOSIS — N1832 Chronic kidney disease, stage 3b: Secondary | ICD-10-CM | POA: Diagnosis not present

## 2024-01-10 DIAGNOSIS — I10 Essential (primary) hypertension: Secondary | ICD-10-CM | POA: Diagnosis not present

## 2024-01-10 DIAGNOSIS — Z299 Encounter for prophylactic measures, unspecified: Secondary | ICD-10-CM | POA: Diagnosis not present

## 2024-02-16 DIAGNOSIS — Z7189 Other specified counseling: Secondary | ICD-10-CM | POA: Diagnosis not present

## 2024-02-16 DIAGNOSIS — Z79899 Other long term (current) drug therapy: Secondary | ICD-10-CM | POA: Diagnosis not present

## 2024-02-16 DIAGNOSIS — Z299 Encounter for prophylactic measures, unspecified: Secondary | ICD-10-CM | POA: Diagnosis not present

## 2024-02-16 DIAGNOSIS — R5383 Other fatigue: Secondary | ICD-10-CM | POA: Diagnosis not present

## 2024-02-16 DIAGNOSIS — Z0000 Encounter for general adult medical examination without abnormal findings: Secondary | ICD-10-CM | POA: Diagnosis not present

## 2024-02-16 DIAGNOSIS — Z6841 Body Mass Index (BMI) 40.0 and over, adult: Secondary | ICD-10-CM | POA: Diagnosis not present

## 2024-02-16 DIAGNOSIS — Z1339 Encounter for screening examination for other mental health and behavioral disorders: Secondary | ICD-10-CM | POA: Diagnosis not present

## 2024-02-16 DIAGNOSIS — Z1331 Encounter for screening for depression: Secondary | ICD-10-CM | POA: Diagnosis not present

## 2024-02-16 DIAGNOSIS — I10 Essential (primary) hypertension: Secondary | ICD-10-CM | POA: Diagnosis not present

## 2024-02-16 DIAGNOSIS — E7800 Pure hypercholesterolemia, unspecified: Secondary | ICD-10-CM | POA: Diagnosis not present

## 2024-03-23 DIAGNOSIS — Z23 Encounter for immunization: Secondary | ICD-10-CM | POA: Diagnosis not present

## 2024-05-01 DIAGNOSIS — D631 Anemia in chronic kidney disease: Secondary | ICD-10-CM | POA: Diagnosis not present

## 2024-05-01 DIAGNOSIS — E211 Secondary hyperparathyroidism, not elsewhere classified: Secondary | ICD-10-CM | POA: Diagnosis not present

## 2024-05-01 DIAGNOSIS — I10 Essential (primary) hypertension: Secondary | ICD-10-CM | POA: Diagnosis not present

## 2024-05-01 DIAGNOSIS — E119 Type 2 diabetes mellitus without complications: Secondary | ICD-10-CM | POA: Diagnosis not present

## 2024-05-01 DIAGNOSIS — N189 Chronic kidney disease, unspecified: Secondary | ICD-10-CM | POA: Diagnosis not present

## 2024-05-01 DIAGNOSIS — R809 Proteinuria, unspecified: Secondary | ICD-10-CM | POA: Diagnosis not present

## 2024-05-08 DIAGNOSIS — R809 Proteinuria, unspecified: Secondary | ICD-10-CM | POA: Diagnosis not present

## 2024-05-08 DIAGNOSIS — N1832 Chronic kidney disease, stage 3b: Secondary | ICD-10-CM | POA: Diagnosis not present

## 2024-05-08 DIAGNOSIS — E1122 Type 2 diabetes mellitus with diabetic chronic kidney disease: Secondary | ICD-10-CM | POA: Diagnosis not present

## 2024-05-08 DIAGNOSIS — I129 Hypertensive chronic kidney disease with stage 1 through stage 4 chronic kidney disease, or unspecified chronic kidney disease: Secondary | ICD-10-CM | POA: Diagnosis not present

## 2024-05-10 DIAGNOSIS — E119 Type 2 diabetes mellitus without complications: Secondary | ICD-10-CM | POA: Diagnosis not present

## 2024-05-10 DIAGNOSIS — E1165 Type 2 diabetes mellitus with hyperglycemia: Secondary | ICD-10-CM | POA: Diagnosis not present

## 2024-05-10 DIAGNOSIS — Z299 Encounter for prophylactic measures, unspecified: Secondary | ICD-10-CM | POA: Diagnosis not present

## 2024-05-10 DIAGNOSIS — I10 Essential (primary) hypertension: Secondary | ICD-10-CM | POA: Diagnosis not present

## 2024-05-10 DIAGNOSIS — Z6841 Body Mass Index (BMI) 40.0 and over, adult: Secondary | ICD-10-CM | POA: Diagnosis not present

## 2024-05-10 DIAGNOSIS — I152 Hypertension secondary to endocrine disorders: Secondary | ICD-10-CM | POA: Diagnosis not present

## 2024-05-10 DIAGNOSIS — N1832 Chronic kidney disease, stage 3b: Secondary | ICD-10-CM | POA: Diagnosis not present

## 2024-05-19 DIAGNOSIS — M79673 Pain in unspecified foot: Secondary | ICD-10-CM | POA: Diagnosis not present

## 2024-05-19 DIAGNOSIS — R52 Pain, unspecified: Secondary | ICD-10-CM | POA: Diagnosis not present

## 2024-05-19 DIAGNOSIS — I10 Essential (primary) hypertension: Secondary | ICD-10-CM | POA: Diagnosis not present

## 2024-05-19 DIAGNOSIS — M25561 Pain in right knee: Secondary | ICD-10-CM | POA: Diagnosis not present

## 2024-05-19 DIAGNOSIS — Z299 Encounter for prophylactic measures, unspecified: Secondary | ICD-10-CM | POA: Diagnosis not present

## 2024-05-29 ENCOUNTER — Ambulatory Visit (HOSPITAL_COMMUNITY)
Admission: RE | Admit: 2024-05-29 | Discharge: 2024-05-29 | Disposition: A | Discharge status: Home / Self Care | Source: Ambulatory Visit | Attending: Student | Admitting: Student

## 2024-05-29 ENCOUNTER — Other Ambulatory Visit (HOSPITAL_COMMUNITY): Payer: Self-pay | Admitting: Student

## 2024-05-29 DIAGNOSIS — M47816 Spondylosis without myelopathy or radiculopathy, lumbar region: Secondary | ICD-10-CM | POA: Diagnosis not present

## 2024-05-29 DIAGNOSIS — R52 Pain, unspecified: Secondary | ICD-10-CM | POA: Diagnosis not present

## 2024-05-29 DIAGNOSIS — M5430 Sciatica, unspecified side: Secondary | ICD-10-CM | POA: Diagnosis not present

## 2024-05-29 DIAGNOSIS — M47815 Spondylosis without myelopathy or radiculopathy, thoracolumbar region: Secondary | ICD-10-CM | POA: Diagnosis not present

## 2024-05-29 DIAGNOSIS — Z299 Encounter for prophylactic measures, unspecified: Secondary | ICD-10-CM | POA: Diagnosis not present

## 2024-05-29 DIAGNOSIS — M4316 Spondylolisthesis, lumbar region: Secondary | ICD-10-CM | POA: Diagnosis not present

## 2024-05-29 DIAGNOSIS — M419 Scoliosis, unspecified: Secondary | ICD-10-CM | POA: Diagnosis not present

## 2024-05-29 DIAGNOSIS — M25561 Pain in right knee: Secondary | ICD-10-CM | POA: Diagnosis not present

## 2024-05-29 DIAGNOSIS — I10 Essential (primary) hypertension: Secondary | ICD-10-CM | POA: Diagnosis not present

## 2024-06-01 ENCOUNTER — Ambulatory Visit (HOSPITAL_COMMUNITY)
Admission: RE | Admit: 2024-06-01 | Discharge: 2024-06-01 | Disposition: A | Discharge status: Home / Self Care | Source: Ambulatory Visit | Attending: Internal Medicine | Admitting: Internal Medicine

## 2024-06-01 ENCOUNTER — Other Ambulatory Visit (HOSPITAL_COMMUNITY): Payer: Self-pay | Admitting: Internal Medicine

## 2024-06-01 DIAGNOSIS — M25561 Pain in right knee: Secondary | ICD-10-CM | POA: Diagnosis not present

## 2024-06-01 DIAGNOSIS — M79673 Pain in unspecified foot: Secondary | ICD-10-CM | POA: Diagnosis not present

## 2024-06-01 DIAGNOSIS — M79671 Pain in right foot: Secondary | ICD-10-CM

## 2024-06-01 DIAGNOSIS — M7989 Other specified soft tissue disorders: Secondary | ICD-10-CM | POA: Diagnosis not present

## 2024-06-01 DIAGNOSIS — M1710 Unilateral primary osteoarthritis, unspecified knee: Secondary | ICD-10-CM | POA: Diagnosis not present

## 2024-06-01 DIAGNOSIS — M79661 Pain in right lower leg: Secondary | ICD-10-CM | POA: Insufficient documentation

## 2024-06-01 DIAGNOSIS — M25569 Pain in unspecified knee: Secondary | ICD-10-CM | POA: Diagnosis not present

## 2024-06-15 DIAGNOSIS — M6281 Muscle weakness (generalized): Secondary | ICD-10-CM | POA: Diagnosis not present

## 2024-06-15 DIAGNOSIS — M5430 Sciatica, unspecified side: Secondary | ICD-10-CM | POA: Diagnosis not present

## 2024-06-15 DIAGNOSIS — Z9181 History of falling: Secondary | ICD-10-CM | POA: Diagnosis not present

## 2024-06-19 DIAGNOSIS — Z9181 History of falling: Secondary | ICD-10-CM | POA: Diagnosis not present

## 2024-06-19 DIAGNOSIS — M6281 Muscle weakness (generalized): Secondary | ICD-10-CM | POA: Diagnosis not present

## 2024-06-19 DIAGNOSIS — M5430 Sciatica, unspecified side: Secondary | ICD-10-CM | POA: Diagnosis not present

## 2024-06-22 ENCOUNTER — Ambulatory Visit: Admitting: Orthopedic Surgery

## 2024-06-22 ENCOUNTER — Other Ambulatory Visit: Payer: Self-pay

## 2024-06-22 ENCOUNTER — Encounter: Payer: Self-pay | Admitting: Orthopedic Surgery

## 2024-06-22 VITALS — BP 141/80 | Ht 67.0 in | Wt 263.0 lb

## 2024-06-22 DIAGNOSIS — S92341A Displaced fracture of fourth metatarsal bone, right foot, initial encounter for closed fracture: Secondary | ICD-10-CM

## 2024-06-22 DIAGNOSIS — S92321A Displaced fracture of second metatarsal bone, right foot, initial encounter for closed fracture: Secondary | ICD-10-CM | POA: Diagnosis not present

## 2024-06-22 DIAGNOSIS — S92331A Displaced fracture of third metatarsal bone, right foot, initial encounter for closed fracture: Secondary | ICD-10-CM | POA: Diagnosis not present

## 2024-06-22 DIAGNOSIS — S92351A Displaced fracture of fifth metatarsal bone, right foot, initial encounter for closed fracture: Secondary | ICD-10-CM | POA: Diagnosis not present

## 2024-06-22 NOTE — Progress Notes (Signed)
° °  Patient: Nicole Sampson           Date of Birth: 1951-02-11           MRN: [id number] Visit Date: 06/22/2024 Requested by: Hairfield, Keavie C [address] [place],  [place] [address] PCP: Lori Thedora BROCKS  Encounter Diagnoses  Name Primary?   Closed displaced fracture of second metatarsal bone of right foot, initial encounter Yes   Closed displaced fracture of third metatarsal bone of right foot, initial encounter    Closed displaced fracture of fourth metatarsal bone of right foot, initial encounter    Closed displaced fracture of fifth metatarsal bone of right foot, initial encounter     Assessment and plan:  #2 #3 #4 #5 metatarsal fractures  Right foot  Subacute  No treatment recommended resume normal activities as tolerated I do not recommend surgery  Repeat x-ray in 4 weeks  No orders of the defined types were placed in this encounter.    Chief Complaint  Patient presents with   Foot Problem    RIGHT barrie 06/01/24    History:  73 year old female history of ORIF right ankle injured her foot early November finally had an x-ray November 20 presents now for evaluation and management as the November 20 x-ray showed multiple fractures of the metatarsals of the foot  She complains of pain over the dorsum of the foot discomfort some swelling which is intermittent seems to be resolved overnight  Focused exam findings: Right foot some discoloration over the dorsum of the foot at the metatarsal phalangeal joints of 234 and 5 digits with tenderness in this area.  Fibula nontender.  Anterior talofibular ligament nontender.  Ankle stable.    Previous x-ray on 1120 shows fractures of the 2nd, 3rd, 4th and 5th metacarpals near the neck with displacement.  Plate and screw construct for ankle fracture intact  Imaging today  DG Foot Complete Right Result Date: 06/22/2024 Managing of the right foot Follow-up on the 2nd, 3rd, 4th and 5th metatarsal fractures Fractures  located around the neck of the metatarsals The second metatarsal fracture has a significant amount of displacement distal 3rd, 4th and 5th minimal The fractures are healing fracture lines are still visible Recommend repeat imaging in a few weeks

## 2024-06-22 NOTE — Progress Notes (Signed)
°  Intake history:  Chief Complaint  Patient presents with   Foot Problem    RIGHT barrie 06/01/24     BP (!) 141/80   Ht 5' 7 (1.702 m)   Wt 263 lb (119.3 kg)   BMI 41.19 kg/m  Body mass index is 41.19 kg/m.  Pharmacy? ___________[facility]___________  WHAT ARE WE SEEING YOU FOR TODAY?   Right foot   How long has this bothered you? (DOI?DOS?WS?)  5-6 weeks ago  Was there an injury? Yes leg was numb and gave out fell twisted foot  Anticoag.  No   Any ALLERGIES _______lime Jello _______________________________________   Treatment:  Have you taken:  Tylenol  No  Advil  No  Had PT YES   Had injection No  Other  _________________________

## 2024-06-23 DIAGNOSIS — Z9181 History of falling: Secondary | ICD-10-CM | POA: Diagnosis not present

## 2024-06-23 DIAGNOSIS — M6281 Muscle weakness (generalized): Secondary | ICD-10-CM | POA: Diagnosis not present

## 2024-06-23 DIAGNOSIS — M5430 Sciatica, unspecified side: Secondary | ICD-10-CM | POA: Diagnosis not present

## 2024-07-20 ENCOUNTER — Encounter: Payer: Self-pay | Admitting: Orthopedic Surgery

## 2024-07-20 ENCOUNTER — Other Ambulatory Visit: Payer: Self-pay

## 2024-07-20 ENCOUNTER — Ambulatory Visit: Admitting: Orthopedic Surgery

## 2024-07-20 DIAGNOSIS — S92321D Displaced fracture of second metatarsal bone, right foot, subsequent encounter for fracture with routine healing: Secondary | ICD-10-CM | POA: Diagnosis not present

## 2024-07-20 DIAGNOSIS — S92331D Displaced fracture of third metatarsal bone, right foot, subsequent encounter for fracture with routine healing: Secondary | ICD-10-CM | POA: Diagnosis not present

## 2024-07-20 DIAGNOSIS — S92351D Displaced fracture of fifth metatarsal bone, right foot, subsequent encounter for fracture with routine healing: Secondary | ICD-10-CM

## 2024-07-20 DIAGNOSIS — S92341D Displaced fracture of fourth metatarsal bone, right foot, subsequent encounter for fracture with routine healing: Secondary | ICD-10-CM

## 2024-07-20 NOTE — Progress Notes (Signed)
" ° ° °  07/20/2024   Chief Complaint  Patient presents with   Foot Injury    Right / early November 2025 date of injury    Encounter Diagnoses  Name Primary?   Closed displaced fracture of second metatarsal bone of right foot with routine healing, subsequent encounter Yes   Closed displaced fracture of third metatarsal bone of right foot with routine healing, subsequent encounter    Closed displaced fracture of fourth metatarsal bone of right foot with routine healing, subsequent encounter    Closed displaced fracture of fifth metatarsal bone of right foot with routine healing, subsequent encounter     What pharmacy do you use ? ___________________________  DOI/DOS/ Date: Nov  Did you get better, worse or no change (Answer below)   Improved      "

## 2024-07-20 NOTE — Progress Notes (Signed)
"  ° °  FRACTURE CARE F/U  07/20/2024  Chief Complaint  Patient presents with   Foot Injury    Right / early November 2025 date of injury    Encounter Diagnoses  Name Primary?   Closed displaced fracture of second metatarsal bone of right foot with routine healing, subsequent encounter Yes   Closed displaced fracture of third metatarsal bone of right foot with routine healing, subsequent encounter    Closed displaced fracture of fourth metatarsal bone of right foot with routine healing, subsequent encounter    Closed displaced fracture of fifth metatarsal bone of right foot with routine healing, subsequent encounter     DG Foot Complete Right Result Date: 07/20/2024 X-ray report Chief complaint foot pain Fractures of 2nd through 4th and fifth metatarsals, date of first x-ray June 01, 2024 Images.  We have 3 views right elbow fractures in the right foot which include the 2nd, 3rd, 4th and 5th metatarsals 4.  And 5 or normal On the [facility] view the 2nd and 3rd have some shift and angulation towards the fibula but good callus is seen around the fractures of all 4 digits with some persistent fracture line of #2 and 3 Impression: Presumably healed fractures of second, third, fourth, fifth metatarsal the distal metaphyseal diaphyseal function     Right Ankle Exam    Comments:  Right foot  Second toe has a hammertoe deformity  No angulation in any of the other digits  Slight skin discoloration  Minimal tenderness at the fracture sites         PRN   "
